# Patient Record
Sex: Female | Born: 1935
Health system: Southern US, Community
[De-identification: ages and names within clinical notes are randomized; demographics above are authoritative.]

## PROBLEM LIST (undated history)

## (undated) DIAGNOSIS — I4891 Unspecified atrial fibrillation: Secondary | ICD-10-CM

## (undated) DIAGNOSIS — Z923 Personal history of irradiation: Secondary | ICD-10-CM

## (undated) DIAGNOSIS — E119 Type 2 diabetes mellitus without complications: Secondary | ICD-10-CM

## (undated) DIAGNOSIS — C50919 Malignant neoplasm of unspecified site of unspecified female breast: Secondary | ICD-10-CM

## (undated) HISTORY — PX: FOOT SURGERY: SHX648

## (undated) HISTORY — DX: Unspecified atrial fibrillation: I48.91

## (undated) HISTORY — PX: APPENDECTOMY: SHX54

## (undated) HISTORY — PX: BREAST SURGERY: SHX581

## (undated) HISTORY — PX: EYE SURGERY: SHX253

## (undated) HISTORY — DX: Type 2 diabetes mellitus without complications: E11.9

## (undated) HISTORY — DX: Malignant neoplasm of unspecified site of unspecified female breast: C50.919

## (undated) HISTORY — PX: VAGINAL HYSTERECTOMY: SUR661

---

## 2004-03-16 ENCOUNTER — Ambulatory Visit: Payer: Self-pay | Admitting: Gastroenterology

## 2004-10-15 ENCOUNTER — Ambulatory Visit: Payer: Self-pay | Admitting: Internal Medicine

## 2005-10-18 ENCOUNTER — Ambulatory Visit: Payer: Self-pay | Admitting: Family Medicine

## 2006-10-24 ENCOUNTER — Ambulatory Visit: Payer: Self-pay | Admitting: Family Medicine

## 2007-11-29 ENCOUNTER — Ambulatory Visit: Payer: Self-pay | Admitting: Family Medicine

## 2008-01-04 ENCOUNTER — Ambulatory Visit: Payer: Self-pay | Admitting: Unknown Physician Specialty

## 2008-01-16 ENCOUNTER — Ambulatory Visit: Payer: Self-pay | Admitting: Unknown Physician Specialty

## 2008-02-06 ENCOUNTER — Ambulatory Visit: Payer: Self-pay | Admitting: Unknown Physician Specialty

## 2008-03-14 ENCOUNTER — Ambulatory Visit: Payer: Self-pay | Admitting: Family Medicine

## 2008-03-17 ENCOUNTER — Ambulatory Visit: Payer: Self-pay | Admitting: Family Medicine

## 2008-04-17 ENCOUNTER — Ambulatory Visit: Payer: Self-pay | Admitting: Family Medicine

## 2008-05-17 ENCOUNTER — Ambulatory Visit: Payer: Self-pay | Admitting: Family Medicine

## 2008-12-01 ENCOUNTER — Ambulatory Visit: Payer: Self-pay | Admitting: Family Medicine

## 2009-12-24 ENCOUNTER — Ambulatory Visit: Payer: Self-pay | Admitting: Family Medicine

## 2010-01-07 ENCOUNTER — Ambulatory Visit: Payer: Self-pay | Admitting: Family Medicine

## 2010-01-17 DIAGNOSIS — C50919 Malignant neoplasm of unspecified site of unspecified female breast: Secondary | ICD-10-CM

## 2010-01-17 DIAGNOSIS — Z923 Personal history of irradiation: Secondary | ICD-10-CM

## 2010-01-17 DIAGNOSIS — C50411 Malignant neoplasm of upper-outer quadrant of right female breast: Secondary | ICD-10-CM | POA: Insufficient documentation

## 2010-01-17 HISTORY — DX: Personal history of irradiation: Z92.3

## 2010-01-17 HISTORY — PX: BREAST BIOPSY: SHX20

## 2010-01-17 HISTORY — PX: BREAST LUMPECTOMY: SHX2

## 2010-01-17 HISTORY — DX: Malignant neoplasm of unspecified site of unspecified female breast: C50.919

## 2010-02-08 ENCOUNTER — Ambulatory Visit: Payer: Self-pay | Admitting: Surgery

## 2010-02-15 ENCOUNTER — Ambulatory Visit: Payer: Self-pay | Admitting: Surgery

## 2010-02-19 LAB — PATHOLOGY REPORT

## 2010-02-22 ENCOUNTER — Ambulatory Visit: Payer: Self-pay | Admitting: Surgery

## 2010-02-25 LAB — PATHOLOGY REPORT

## 2010-03-02 ENCOUNTER — Ambulatory Visit: Payer: Self-pay | Admitting: Oncology

## 2010-03-10 ENCOUNTER — Ambulatory Visit: Payer: Self-pay | Admitting: Oncology

## 2010-03-18 ENCOUNTER — Ambulatory Visit: Payer: Self-pay | Admitting: Oncology

## 2010-04-18 ENCOUNTER — Ambulatory Visit: Payer: Self-pay | Admitting: Oncology

## 2010-05-18 ENCOUNTER — Ambulatory Visit: Payer: Self-pay | Admitting: Oncology

## 2010-06-18 ENCOUNTER — Ambulatory Visit: Payer: Self-pay | Admitting: Oncology

## 2010-07-18 ENCOUNTER — Ambulatory Visit: Payer: Self-pay | Admitting: Oncology

## 2010-09-07 ENCOUNTER — Ambulatory Visit: Payer: Self-pay | Admitting: Oncology

## 2010-09-18 ENCOUNTER — Ambulatory Visit: Payer: Self-pay | Admitting: Oncology

## 2010-12-28 ENCOUNTER — Ambulatory Visit: Payer: Self-pay | Admitting: Oncology

## 2010-12-29 ENCOUNTER — Ambulatory Visit: Payer: Self-pay | Admitting: Oncology

## 2010-12-31 ENCOUNTER — Ambulatory Visit: Payer: Self-pay | Admitting: Oncology

## 2011-01-18 ENCOUNTER — Ambulatory Visit: Payer: Self-pay | Admitting: Oncology

## 2011-01-18 HISTORY — PX: BREAST BIOPSY: SHX20

## 2011-02-18 ENCOUNTER — Ambulatory Visit: Payer: Self-pay | Admitting: Oncology

## 2011-03-25 ENCOUNTER — Ambulatory Visit: Payer: Self-pay | Admitting: Oncology

## 2011-04-18 ENCOUNTER — Ambulatory Visit: Payer: Self-pay | Admitting: Oncology

## 2011-06-27 ENCOUNTER — Ambulatory Visit: Payer: Self-pay | Admitting: Oncology

## 2011-06-29 ENCOUNTER — Ambulatory Visit: Payer: Self-pay | Admitting: Oncology

## 2011-07-11 ENCOUNTER — Ambulatory Visit: Payer: Self-pay | Admitting: Surgery

## 2011-07-12 LAB — PATHOLOGY REPORT

## 2011-07-18 ENCOUNTER — Ambulatory Visit: Payer: Self-pay | Admitting: Oncology

## 2011-07-22 LAB — URINALYSIS, COMPLETE
Bilirubin,UR: NEGATIVE
Glucose,UR: NEGATIVE mg/dL (ref 0–75)
Ketone: NEGATIVE
Nitrite: NEGATIVE
Protein: 100
RBC,UR: 4231 /HPF (ref 0–5)
Specific Gravity: 1.029 (ref 1.003–1.030)
Squamous Epithelial: 4
WBC UR: 1427 /HPF (ref 0–5)

## 2011-07-24 LAB — URINE CULTURE

## 2011-08-18 ENCOUNTER — Ambulatory Visit: Payer: Self-pay | Admitting: Oncology

## 2011-12-29 ENCOUNTER — Ambulatory Visit: Payer: Self-pay | Admitting: Surgery

## 2011-12-29 ENCOUNTER — Ambulatory Visit: Payer: Self-pay | Admitting: Oncology

## 2012-01-18 ENCOUNTER — Ambulatory Visit: Payer: Self-pay | Admitting: Oncology

## 2012-02-09 IMAGING — US ULTRASOUND LEFT BREAST
1 series · 17 of 22 positions shown · non-contrast
Comparison: 12/28/2010.

REASON FOR EXAM: AV LT SMALL ASYMMETRY
COMMENTS:

PROCEDURE:     US  - US BREAST LEFT  - December 29, 2010  [DATE]
RESULT:

[Series 1: ultrasound left breast · 17 of 22 slices shown]
[im 1/22]
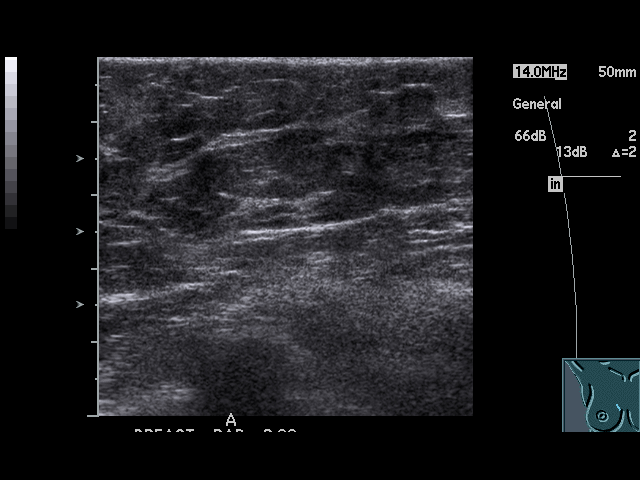
[im 2/22]
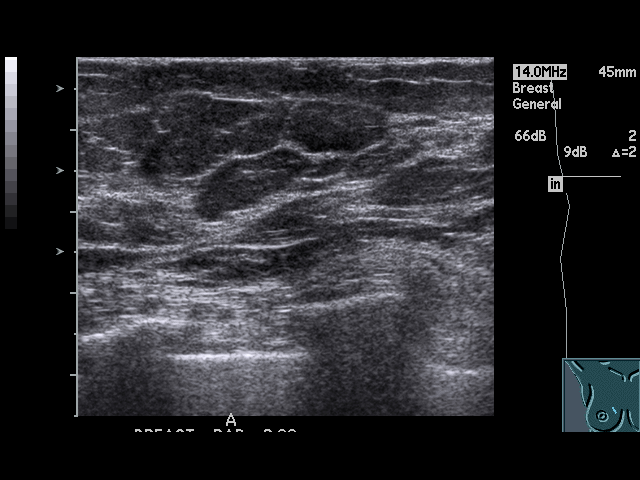
[im 4/22]
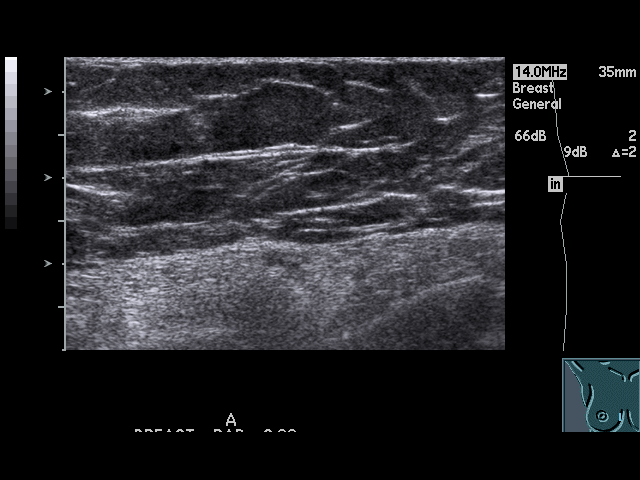
[im 5/22]
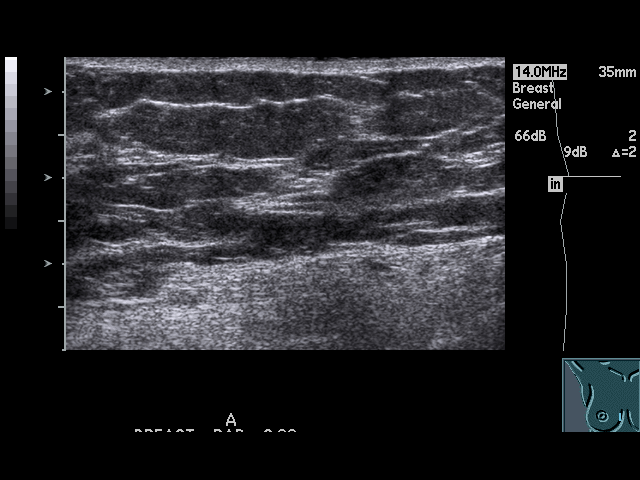
[im 6/22]
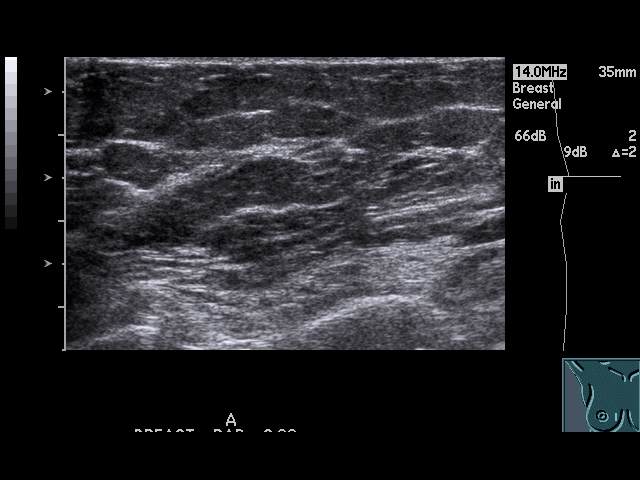
[im 8/22]
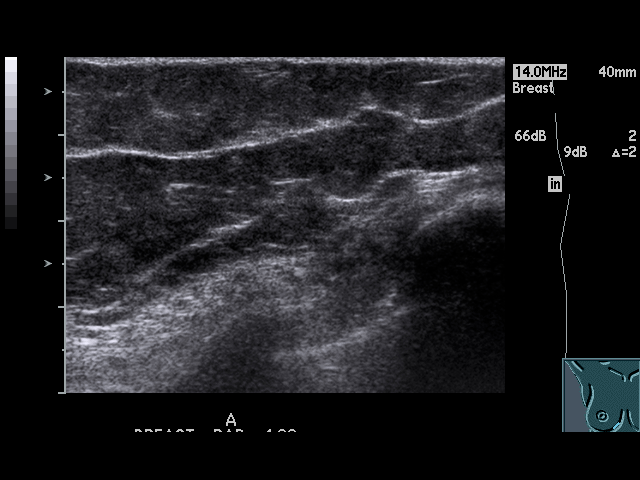
[im 9/22]
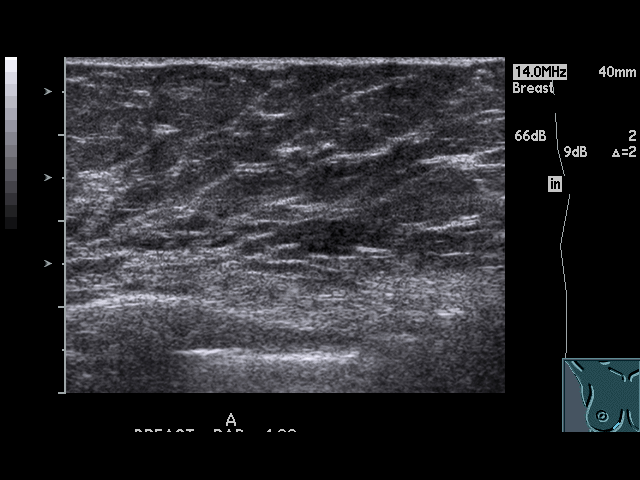
[im 10/22]
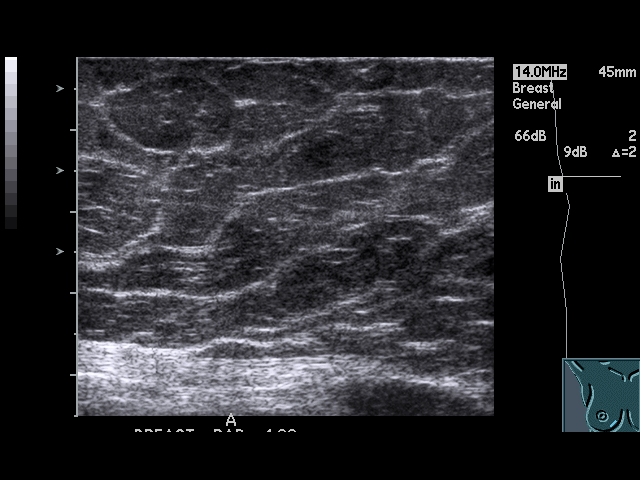
[im 12/22]
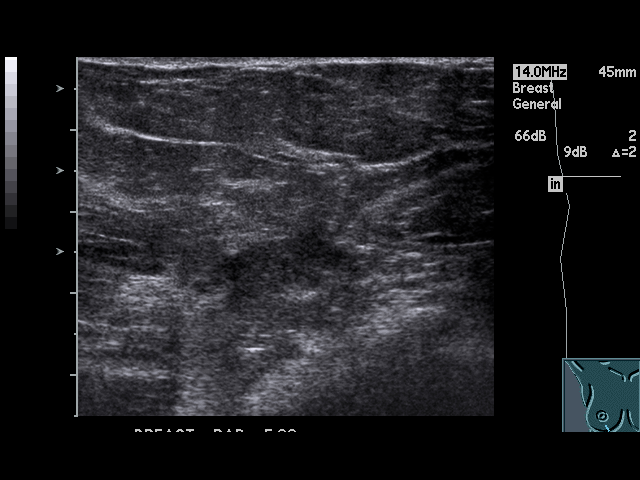
[im 13/22]
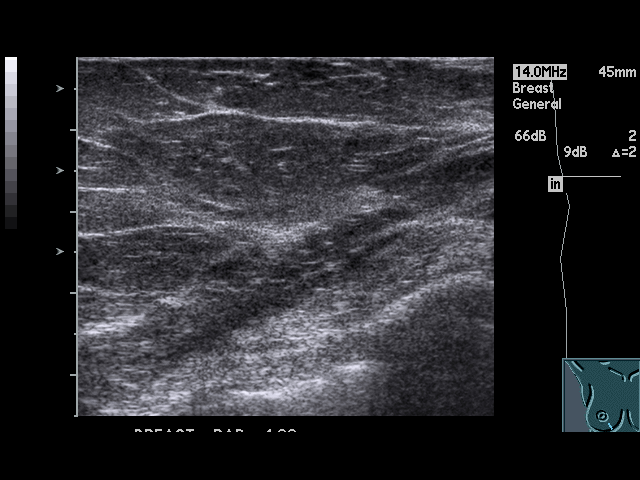
[im 14/22]
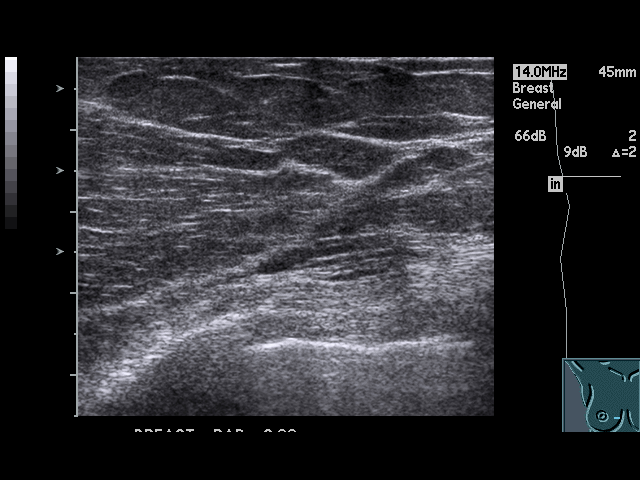
[im 15/22]
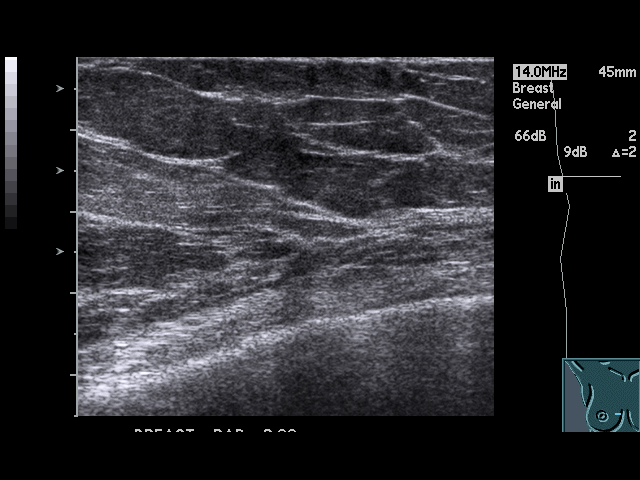
[im 17/22]
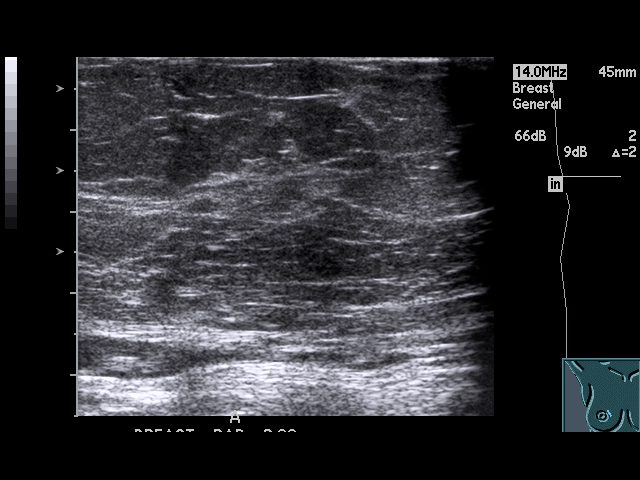
[im 18/22]
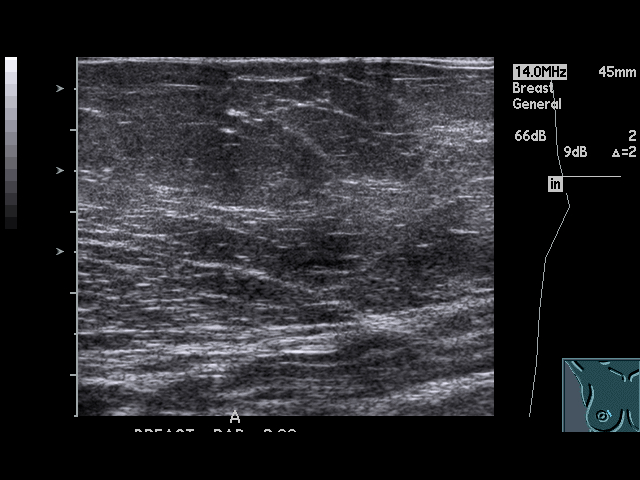
[im 19/22]
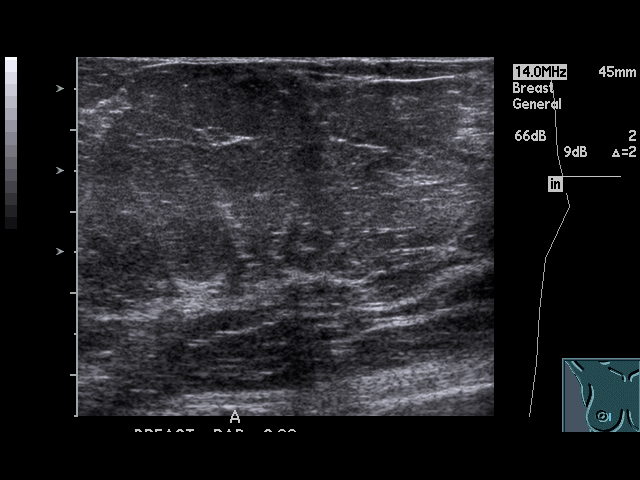
[im 21/22]
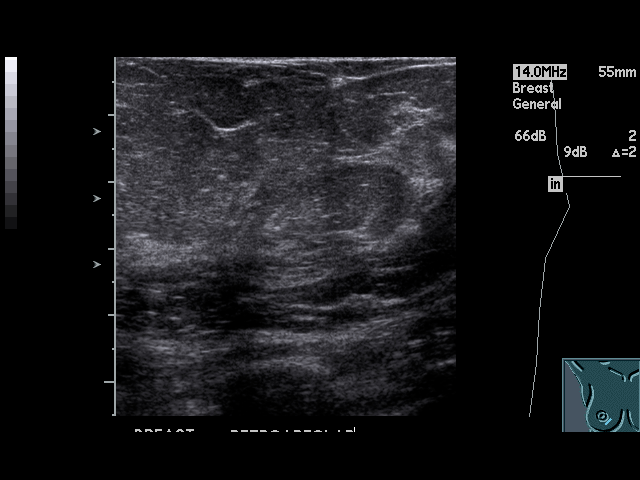
[im 22/22]
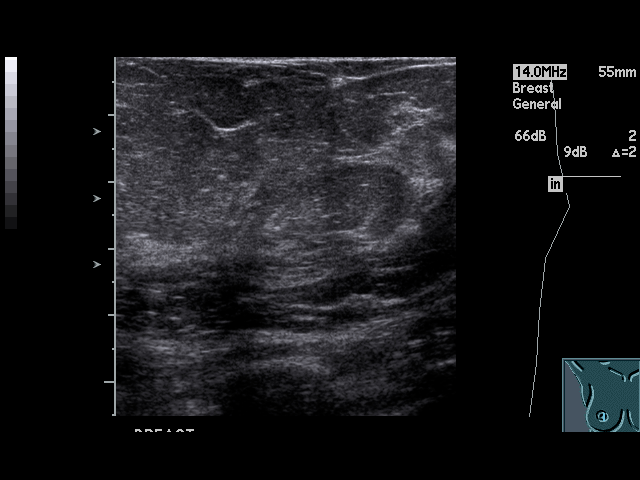

[17 of 22 positions shown; findings below may reference images not displayed]

FINDINGS: Spot compression magnification views were performed to evaluate the findings
on the prior mammograms. With spot compression magnification in the MLO
view, this asymmetry persisted and appeared to have a round contour of a
mass. Some of the borders were well circumscribed while other borders were
obscured by overlying tissue. This is felt to correlate with the small round
mass seen on the exaggerated lateral CC view performed on today's study.
Spot compression magnification in the CC dimension was difficult secondary
to difficulties with patient positioning. However, on at least one of the
spot compression magnification CC views, it appeared to persist. Exaggerated
lateral CC views were also repeated with additional mole markers. However,
this mass did not correlate with the additional mole markers. The mass is
located at approximately 2-3 o'clock in the lateral left breast.

Real-time ultrasound was performed of the left breast from 2 o'clock to 3
o'clock. No mass or suspicious shadowing was identified.
IMPRESSION: BI-RADS: Category 4 - Suspicious Abnormality

RECOMMDATION:  Surgical consultation and tissue diagnosis are suggested for
the indeterminate, small, round mass in the lateral left breast. As this was
not seen by ultrasound, biopsy could be performed with Stereotactic Guided
Biopsy.

## 2012-02-18 ENCOUNTER — Ambulatory Visit: Payer: Self-pay | Admitting: Oncology

## 2012-07-02 ENCOUNTER — Ambulatory Visit: Payer: Self-pay | Admitting: Surgery

## 2012-07-19 ENCOUNTER — Ambulatory Visit: Payer: Self-pay | Admitting: Oncology

## 2012-07-23 ENCOUNTER — Ambulatory Visit: Payer: Self-pay | Admitting: Oncology

## 2012-07-25 ENCOUNTER — Ambulatory Visit: Payer: Self-pay | Admitting: Family Medicine

## 2012-08-17 ENCOUNTER — Ambulatory Visit: Payer: Self-pay | Admitting: Oncology

## 2012-08-17 ENCOUNTER — Ambulatory Visit: Payer: Self-pay | Admitting: Family Medicine

## 2012-09-17 ENCOUNTER — Ambulatory Visit: Payer: Self-pay | Admitting: Family Medicine

## 2012-12-27 ENCOUNTER — Ambulatory Visit: Payer: Self-pay | Admitting: Oncology

## 2013-01-15 ENCOUNTER — Ambulatory Visit: Payer: Self-pay | Admitting: Oncology

## 2013-01-17 ENCOUNTER — Ambulatory Visit: Payer: Self-pay | Admitting: Oncology

## 2013-01-31 ENCOUNTER — Ambulatory Visit: Payer: Self-pay | Admitting: Podiatrist

## 2013-02-14 ENCOUNTER — Ambulatory Visit: Payer: Self-pay | Admitting: Podiatrist

## 2013-02-17 ENCOUNTER — Ambulatory Visit: Payer: Self-pay | Admitting: Oncology

## 2013-02-21 ENCOUNTER — Encounter: Payer: Self-pay | Admitting: Podiatrist

## 2013-02-21 ENCOUNTER — Ambulatory Visit (INDEPENDENT_AMBULATORY_CARE_PROVIDER_SITE_OTHER): Payer: Medicare Other | Admitting: Podiatrist

## 2013-02-21 VITALS — BP 102/53 | HR 64 | Resp 16 | Ht 60.0 in | Wt 188.0 lb

## 2013-02-21 DIAGNOSIS — M79609 Pain in unspecified limb: Secondary | ICD-10-CM

## 2013-02-21 DIAGNOSIS — B351 Tinea unguium: Secondary | ICD-10-CM

## 2013-02-21 NOTE — Patient Instructions (Signed)
KERASAL topical nail film-- available at drug stores in the foot care isle--

## 2013-02-21 NOTE — Progress Notes (Signed)
HPI:  Patient presents today for follow up of foot and nail care. She has a thick toenail on the 5th digit of the right foot which she is unable to trim on her own.  She is able to take care of the remaining toenails  Objective:  Patients chart is reviewed.  Neurovascular status unchanged with palpable, symmetric pedal pulses at 2/4 bilateral.  Patients nails are thickened, discolored, distrophic, friable and brittle with yellow-brown discoloration of the 5th digital nail right. Patient subjectively relates it is painful with shoes and with ambulation   Assessment:  Symptomatic onychomycosis x 1 toenail  Plan:  Discussed treatment options and alternatives.  The symptomatic toenail was debrided through manual an mechanical means without complication.  Recommended otc kerasal nail treatment for the nail.  Patient to return prn    Trudie Buckler, DPM

## 2013-07-03 ENCOUNTER — Ambulatory Visit: Payer: Self-pay | Admitting: Oncology

## 2013-07-05 ENCOUNTER — Ambulatory Visit: Payer: Self-pay | Admitting: Oncology

## 2013-07-17 ENCOUNTER — Ambulatory Visit: Payer: Self-pay | Admitting: Oncology

## 2014-01-02 ENCOUNTER — Ambulatory Visit: Payer: Self-pay | Admitting: Oncology

## 2014-01-13 LAB — CBC CANCER CENTER
BASOS ABS: 0.1 x10 3/mm (ref 0.0–0.1)
Basophil %: 0.7 %
EOS ABS: 0.1 x10 3/mm (ref 0.0–0.7)
Eosinophil %: 1.1 %
HCT: 43.6 % (ref 35.0–47.0)
HGB: 14.3 g/dL (ref 12.0–16.0)
Lymphocyte #: 1.6 x10 3/mm (ref 1.0–3.6)
Lymphocyte %: 21.8 %
MCH: 31.5 pg (ref 26.0–34.0)
MCHC: 32.8 g/dL (ref 32.0–36.0)
MCV: 96 fL (ref 80–100)
MONOS PCT: 9.9 %
Monocyte #: 0.7 x10 3/mm (ref 0.2–0.9)
NEUTROS ABS: 4.8 x10 3/mm (ref 1.4–6.5)
Neutrophil %: 66.5 %
Platelet: 260 x10 3/mm (ref 150–440)
RBC: 4.54 10*6/uL (ref 3.80–5.20)
RDW: 12.9 % (ref 11.5–14.5)
WBC: 7.2 x10 3/mm (ref 3.6–11.0)

## 2014-01-13 LAB — COMPREHENSIVE METABOLIC PANEL
ALK PHOS: 68 U/L
ANION GAP: 11 (ref 7–16)
Albumin: 3.6 g/dL (ref 3.4–5.0)
BUN: 18 mg/dL (ref 7–18)
Bilirubin,Total: 0.4 mg/dL (ref 0.2–1.0)
CALCIUM: 9.4 mg/dL (ref 8.5–10.1)
CHLORIDE: 101 mmol/L (ref 98–107)
Co2: 29 mmol/L (ref 21–32)
Creatinine: 0.84 mg/dL (ref 0.60–1.30)
EGFR (Non-African Amer.): >60
Glucose: 158 mg/dL — ABNORMAL HIGH (ref 65–99)
Osmolality: 286 (ref 275–301)
Potassium: 4 mmol/L (ref 3.5–5.1)
SGOT(AST): 13 U/L — ABNORMAL LOW (ref 15–37)
SGPT (ALT): 21 U/L
Sodium: 141 mmol/L (ref 136–145)
TOTAL PROTEIN: 7.1 g/dL (ref 6.4–8.2)

## 2014-01-15 LAB — CANCER ANTIGEN 27.29: CA 27.29: 22.4 U/mL (ref 0.0–38.6)

## 2014-01-17 ENCOUNTER — Ambulatory Visit: Payer: Self-pay | Admitting: Oncology

## 2014-04-10 ENCOUNTER — Ambulatory Visit (INDEPENDENT_AMBULATORY_CARE_PROVIDER_SITE_OTHER): Payer: Medicare PPO | Admitting: Podiatry

## 2014-04-10 DIAGNOSIS — B351 Tinea unguium: Secondary | ICD-10-CM | POA: Diagnosis not present

## 2014-04-10 DIAGNOSIS — M79676 Pain in unspecified toe(s): Secondary | ICD-10-CM | POA: Diagnosis not present

## 2014-04-13 NOTE — Progress Notes (Signed)
Patient ID: Lisa Crawford, female   DOB: 1935/08/08, 79 y.o.   MRN: [id number]  Subjective: 79 y.o.-year-old female returns the office today for painful, elongated, thickened bilateral 5th toenails which she is unable to trim herself. Denies any redness or drainage around the nails. Denies any acute changes since last appointment and no new complaints today. Denies any systemic complaints such as fevers, chills, nausea, vomiting.   Objective: AAO 3, NAD DP/PT pulses palpable, CRT less than 3 seconds Protective sensation intact with Simms Weinstein monofilament, Achilles tendon reflex intact.  Nails hypertrophic, dystrophic, elongated, brittle, discolored 2 to bilateral 5th digits. There is tenderness overlying bilateral 5th digit nails. There is no surrounding erythema or drainage along the nail sites. No open lesions or pre-ulcerative lesions are identified. No other areas of tenderness bilateral lower extremities. No overlying edema, erythema, increased warmth. No pain with calf compression, swelling, warmth, erythema.  Assessment: Patient presents with symptomatic onychomycosis x2  Plan: -Treatment options including alternatives, risks, complications were discussed -Nails sharply debrided without complication/bleeding. -Discussed daily foot inspection. If there are any changes, to call the office immediately.  -Follow-up in 3 months or sooner if any problems are to arise. In the meantime, encouraged to call the office with any questions, concerns, changes symptoms.

## 2014-05-02 ENCOUNTER — Other Ambulatory Visit: Payer: Self-pay | Admitting: Oncology

## 2014-05-02 DIAGNOSIS — M810 Age-related osteoporosis without current pathological fracture: Secondary | ICD-10-CM

## 2014-05-02 DIAGNOSIS — Z853 Personal history of malignant neoplasm of breast: Secondary | ICD-10-CM

## 2014-07-07 ENCOUNTER — Other Ambulatory Visit: Payer: Self-pay | Admitting: Oncology

## 2014-07-07 ENCOUNTER — Ambulatory Visit
Admission: RE | Admit: 2014-07-07 | Discharge: 2014-07-07 | Disposition: A | Discharge status: Home / Self Care | Payer: Medicare PPO | Source: Ambulatory Visit | Attending: Oncology | Admitting: Oncology

## 2014-07-07 ENCOUNTER — Ambulatory Visit: Payer: Self-pay

## 2014-07-07 DIAGNOSIS — Z853 Personal history of malignant neoplasm of breast: Secondary | ICD-10-CM

## 2014-07-07 DIAGNOSIS — M810 Age-related osteoporosis without current pathological fracture: Secondary | ICD-10-CM

## 2014-07-07 DIAGNOSIS — M8580 Other specified disorders of bone density and structure, unspecified site: Secondary | ICD-10-CM | POA: Diagnosis not present

## 2014-07-07 DIAGNOSIS — Z13820 Encounter for screening for osteoporosis: Secondary | ICD-10-CM | POA: Diagnosis present

## 2014-07-17 ENCOUNTER — Inpatient Hospital Stay: Payer: Medicare PPO | Attending: Oncology | Admitting: Oncology

## 2014-07-17 VITALS — BP 126/75 | HR 57 | Temp 98.4°F | Resp 19 | Ht 61.0 in | Wt 199.5 lb

## 2014-07-17 DIAGNOSIS — Z79899 Other long term (current) drug therapy: Secondary | ICD-10-CM | POA: Diagnosis not present

## 2014-07-17 DIAGNOSIS — G4700 Insomnia, unspecified: Secondary | ICD-10-CM | POA: Insufficient documentation

## 2014-07-17 DIAGNOSIS — Z170 Estrogen receptor positive status [ER+]: Secondary | ICD-10-CM | POA: Diagnosis not present

## 2014-07-17 DIAGNOSIS — I4891 Unspecified atrial fibrillation: Secondary | ICD-10-CM | POA: Diagnosis not present

## 2014-07-17 DIAGNOSIS — M8580 Other specified disorders of bone density and structure, unspecified site: Secondary | ICD-10-CM | POA: Insufficient documentation

## 2014-07-17 DIAGNOSIS — Z923 Personal history of irradiation: Secondary | ICD-10-CM | POA: Diagnosis not present

## 2014-07-17 DIAGNOSIS — Z79811 Long term (current) use of aromatase inhibitors: Secondary | ICD-10-CM | POA: Insufficient documentation

## 2014-07-17 DIAGNOSIS — I10 Essential (primary) hypertension: Secondary | ICD-10-CM | POA: Insufficient documentation

## 2014-07-17 DIAGNOSIS — E039 Hypothyroidism, unspecified: Secondary | ICD-10-CM | POA: Insufficient documentation

## 2014-07-17 DIAGNOSIS — B029 Zoster without complications: Secondary | ICD-10-CM | POA: Insufficient documentation

## 2014-07-17 DIAGNOSIS — D649 Anemia, unspecified: Secondary | ICD-10-CM | POA: Insufficient documentation

## 2014-07-17 DIAGNOSIS — C50911 Malignant neoplasm of unspecified site of right female breast: Secondary | ICD-10-CM | POA: Insufficient documentation

## 2014-07-17 DIAGNOSIS — H409 Unspecified glaucoma: Secondary | ICD-10-CM | POA: Insufficient documentation

## 2014-07-17 DIAGNOSIS — M1990 Unspecified osteoarthritis, unspecified site: Secondary | ICD-10-CM | POA: Insufficient documentation

## 2014-07-17 DIAGNOSIS — E119 Type 2 diabetes mellitus without complications: Secondary | ICD-10-CM | POA: Insufficient documentation

## 2014-07-17 DIAGNOSIS — R519 Headache, unspecified: Secondary | ICD-10-CM | POA: Insufficient documentation

## 2014-07-17 DIAGNOSIS — R51 Headache: Secondary | ICD-10-CM

## 2014-07-17 DIAGNOSIS — J302 Other seasonal allergic rhinitis: Secondary | ICD-10-CM | POA: Insufficient documentation

## 2014-07-17 DIAGNOSIS — H269 Unspecified cataract: Secondary | ICD-10-CM | POA: Insufficient documentation

## 2014-07-28 NOTE — Progress Notes (Signed)
[facility]  [phone] [phone]  ID: Lisa Crawford [place] OB: 01-20-1935  MR#: [id number]  JOA#:[id number]  Patient Care Team: [place] Pink, MD as PCP - General (Family Medicine)  CHIEF COMPLAINT:  Chief Complaint  Patient presents with  . Follow-up    Breast Cancer    INTERVAL HISTORY: Patient returns to clinic for routine six-month follow-up. She continues to tolerate letrozole well without significant side effects. She denies any hot flashes, myalgias, or arthralgias. She has a good appetite.  She has no neurologic complaints.   She denies any recent fevers or illnesses.  She does not complain of weakness and fatigue.  She denies any bleeding or bruising.  She denies any chest pain, shortness of breath, cough, or hemoptysis.  She denies any nausea, vomiting, constipation, or diarrhea.  She denies any melena or hematochezia.  She offers no specific complaints today.  REVIEW OF SYSTEMS:   Review of Systems  Constitutional: Negative.   Musculoskeletal: Negative.     As per HPI. Otherwise, a complete review of systems is negatve.  PAST MEDICAL HISTORY: Past Medical History  Diagnosis Date  . A-fib   . Diabetes   . Breast cancer right    2012 -radiation    PAST SURGICAL HISTORY: Past Surgical History  Procedure Laterality Date  . Appendectomy    . Vaginal hysterectomy    . Foot surgery Left   . Breast surgery Right   . Eye surgery Bilateral   . Breast biopsy Right 2012    FAMILY HISTORY No family history on file.     ADVANCED DIRECTIVES:    HEALTH MAINTENANCE: History  Substance Use Topics  . Smoking status: Never Smoker   . Smokeless tobacco: Not on file  . Alcohol Use: No     Colonoscopy:  PAP:  Bone density:  Lipid panel:  Allergies  Allergen Reactions  . Antihistamines, Chlorpheniramine-Type Other (See Comments)    Stay awake  . Antihistamines, Diphenhydramine-Type Other (See Comments)    KEEPS AWAKE AND VERY  WIRED    Current Outpatient Prescriptions  Medication Sig Dispense Refill  . acetaminophen (TYLENOL) 500 MG tablet Take 500 mg by mouth 2 (two) times daily.    Marland Kitchen amLODipine-benazepril (LOTREL) 5-40 MG per capsule Take 1 capsule by mouth 2 (two) times daily.    Marland Kitchen Apixaban (ELIQUIS PO) Take by mouth 2 (two) times daily.    Marland Kitchen atenolol (TENORMIN) 25 MG tablet Take by mouth daily.    . divalproex (DEPAKOTE) 500 MG DR tablet Take by mouth.    . flecainide (TAMBOCOR) 50 MG tablet Take 50 mg by mouth 2 (two) times daily.    Marland Kitchen glimepiride (AMARYL) 2 MG tablet TAKE 1 TABLET BY MOUTH 2 (TWO) TIMES DAILY.    Marland Kitchen latanoprost (XALATAN) 0.005 % ophthalmic solution Place 1 drop into both eyes at bedtime.    Marland Kitchen letrozole (FEMARA) 2.5 MG tablet Take 2.5 mg by mouth daily.    . Levothyroxine Sodium 50 MCG CAPS Take by mouth daily before breakfast.    . metFORMIN (GLUCOPHAGE) 500 MG tablet Take 1,000 mg by mouth 2 (two) times daily with a meal.    . Multiple Vitamin (MULTIVITAMIN) tablet Take 1 tablet by mouth daily.    . Timolol Maleate PF 0.5 % SOLN Apply 1 drop to eye every evening.     No current facility-administered medications for this visit.    OBJECTIVE: Filed Vitals:   07/17/14 1149  BP: 126/75  Pulse:  57  Temp: 98.4 F (36.9 C)  Resp: 19     Body mass index is 37.72 kg/(m^2).    ECOG FS:0 - Asymptomatic  General: Well-developed, well-nourished, no acute distress. Eyes: anicteric sclera. Breasts: Bilateral breast and axilla without lumps or masses. Lungs: Clear to auscultation bilaterally. Heart: Regular rate and rhythm. No rubs, murmurs, or gallops. Abdomen: Soft, nontender, nondistended. No organomegaly noted, normoactive bowel sounds. Musculoskeletal: No edema, cyanosis, or clubbing. Neuro: Alert, answering all questions appropriately. Cranial nerves grossly intact. Skin: No rashes or petechiae noted. Psych: Normal affect.  LAB RESULTS:  Lab Results  Component Value Date   NA 141  01/13/2014   K 4.0 01/13/2014   CL 101 01/13/2014   CO2 29 01/13/2014   GLUCOSE 158* 01/13/2014   BUN 18 01/13/2014   CREATININE 0.84 01/13/2014   CALCIUM 9.4 01/13/2014   PROT 7.1 01/13/2014   ALBUMIN 3.6 01/13/2014   AST 13* 01/13/2014   ALT 21 01/13/2014   ALKPHOS 68 01/13/2014    Lab Results  Component Value Date   WBC 7.2 01/13/2014   NEUTROABS 4.8 01/13/2014   HGB 14.3 01/13/2014   HCT 43.6 01/13/2014   MCV 96 01/13/2014   PLT 260 01/13/2014     STUDIES: Dg Bone Density  07/07/2014   EXAM: DUAL X-RAY ABSORPTIOMETRY (DXA) FOR BONE MINERAL DENSITY  IMPRESSION: Dear Dr Grayland Ormond, Your patient Lisa Crawford completed a BMD test on 07/07/2014 using the [place] (analysis version: 14.10) manufactured by [employer]. The following summarizes the results of our evaluation.  PATIENT BIOGRAPHICAL: Name: Lisa, Crawford Patient ID: [id number] Birth Date: 1935/08/13 Height: 60.0 in. Gender: Female Exam Date: 07/07/2014 Weight: 200.0 lbs.  Indications: Advanced Age, Breast CA, Caucasian, Height Loss, History of Fracture (Adult), Hysterectomy, Postmenopausal Fractures: left ankle, Right Foot Treatments: CALCIUM VIT D, femara, LEVOTHYROXINE  ASSESSMENT:  The BMD measured at Femur Neck Right is 0.887 g/cm2 with a T-score of -1.1. This patient is considered osteopenic according to [facility]) criteria. L-3 was excluded due to degenerative changes. Site Region Measured Measured WHO Young Adult BMD Date       Age      Classification T-score AP Spine L1-L4 (L3) 07/07/2014 76.8 Normal 1.8 1.382 g/cm2 AP Spine L1-L4 (L3) 07/03/2013 75.8 Normal 1.4 1.353 g/cm2 AP Spine L1-L4 (L3) 07/19/2012 74.9 Normal 1.7 1.391 g/cm2  DualFemur Neck Right 07/07/2014 76.8 Osteopenia -1.1 0.887 g/cm2 DualFemur Neck Right 07/03/2013 75.8 Osteopenia -1.1 0.881 g/cm2 DualFemur Neck Right 07/19/2012 74.9 Osteopenia -1.1 0.890 g/cm2 DualFemur Neck Right 07/19/2011 73.9 Normal -1.0 0.904 g/cm2  DualFemur Neck Right 06/24/2010 72.8 Normal -0.9 0.917 g/cm2  World Health Organization [facility]) criteria for post-menopausal, Caucasian Women: Normal:       T-score at or above -1 SD Osteopenia:   T-score between -1 and -2.5 SD Osteoporosis: T-score at or below -2.5 SD  RECOMMENDATIONS: Lisa Crawford recommends that FDA-approved medical therapies be considered in postmenopausal women and men age 72 or older with a: 1. Hip or vertebral (clinical or morphometric) fracture. 2. T-score of < -2.5 at the spine or hip. 3. Ten-year fracture probability by FRAX of 3% or greater for hip fracture or 20% or greater for major osteoporotic fracture.  All treatment decisions require clinical judgment and consideration of individual patient factors, including patient preferences, co-morbidities, previous drug use, risk factors not captured in the FRAX model (e.g. falls, vitamin D deficiency, increased bone turnover, interval significant decline in bone density) and possible  under - or over-estimation of fracture risk by FRAX.  All patients should ensure an adequate intake of dietary calcium (1200 mg/d) and vitamin D (800 IU daily) unless contraindicated.  FOLLOW-UP: People with diagnosed cases of osteoporosis or at high risk for fracture should have regular bone mineral density tests. For patients eligible for Medicare, routine testing is allowed once every 2 years. The testing frequency can be increased to one year for patients who have rapidly progressing disease, those who are receiving or discontinuing medical therapy to restore bone mass, or have additional risk factors.  Your patient Lisa Crawford completed a FRAX assessment on 07/07/2014 using the [place] (analysis version: 14.10) manufactured by [employer]. The following summarizes the results of our evaluation.  PATIENT BIOGRAPHICAL: Name: Lisa, Crawford Patient ID: [id number] Birth Date: 1935/06/30 Height:    60.0 in. Gender:     [age]     [age]       Weight:    200.0 lbs. Ethnicity:  White                            Exam Date: 07/07/2014  FRAX* RESULTS:  (version: 3.5) 10-year Probability of Fracture1 Major Osteoporotic Fracture2 Hip Fracture 14.5% 2.2% Population: [place] (Caucasian) Risk Factors: History of Fracture (Adult)  Based on Femur (Right) Neck BMD  1 -The 10-year probability of fracture may be lower than reported if the patient has received treatment. 2 -Major Osteoporotic Fracture: Clinical Spine, Forearm, Hip or Shoulder  *FRAX is a Materials engineer of the [employer] for Metabolic Bone Disease, a [place] (WHO) [facility].  ASSESSMENT: The probability of a major osteoporotic fracture is 14.5% within the next ten years.  The probability of a hip fracture is 2.2% within the next ten years.   Electronically Signed   By: Earle Gell M.D.   On: 07/07/2014 12:25   Mm Diag Breast Tomo Bilateral  07/07/2014   CLINICAL DATA:  79 year old female for annual bilateral mammograms. History of right breast cancer and lumpectomy in 2012.  EXAM: DIGITAL DIAGNOSTIC BILATERAL MAMMOGRAM WITH 3D TOMOSYNTHESIS AND CAD  COMPARISON:  07/03/2013 and prior mammograms dating back to 07/11/2002  ACR Breast Density Category a: The breast tissue is almost entirely fatty.  FINDINGS: A magnification view of the lumpectomy site and routine views of both breasts demonstrate no suspicious mass, nonsurgical distortion or worrisome calcifications.  Right breast scarring again identified.  Mammographic images were processed with CAD.  IMPRESSION: No evidence of breast malignancy.  Right breast scarring.  RECOMMENDATION: Bilateral diagnostic mammograms in 1 year.  I have discussed the findings and recommendations with the patient. Results were also provided in writing at the conclusion of the visit. If applicable, a reminder letter will be sent to the patient regarding the next appointment.  BI-RADS CATEGORY   2: Benign.   Electronically Signed   By: Margarette [place] M.D.   On: 07/07/2014 10:24    ASSESSMENT: Adenocarcinoma of the right breast status post lumpectomy.  ER/PR positive, HER-2 negative, stage Ia.  PLAN:    1.  Breast cancer: No evidence of disease. Continue letrozole completing in May 2017.  Her most recent mammogram on July 07, 2014 was reported as BI-RADS 2. Repeat in June 2017. Return to clinic in 6 months for routine evaluation. 2. Osteopenia:  Her most recent bone mineral density on  July 07, 2014 reported a T score of -1.1, which is unchanged from previous.  Continue calcium and vitamin D.   Patient expressed understanding and was in agreement with this plan. She also understands that She can call clinic at any time with any questions, concerns, or complaints.   Lloyd Huger, MD   07/28/2014 9:36 AM

## 2014-09-04 ENCOUNTER — Encounter: Payer: Self-pay | Admitting: Podiatry

## 2014-09-04 ENCOUNTER — Ambulatory Visit (INDEPENDENT_AMBULATORY_CARE_PROVIDER_SITE_OTHER): Payer: Medicare PPO | Admitting: Podiatry

## 2014-09-04 VITALS — BP 124/79 | HR 60 | Resp 17

## 2014-09-04 DIAGNOSIS — L601 Onycholysis: Secondary | ICD-10-CM

## 2014-09-04 DIAGNOSIS — B351 Tinea unguium: Secondary | ICD-10-CM | POA: Diagnosis not present

## 2014-09-04 NOTE — Progress Notes (Signed)
Patient ID: Lisa Crawford, female   DOB: [birth year], 79 y.o.   MRN: [id number]  Subjective: 79 year old female presents the office today with concerns her right fifth toenail becoming loose. She says a couple weeks ago she caught her toenail the shoe causing toenail to become loose. She had some redness around the nail she would her primary care physician who started on antibiotics which she continues to take. She denies any drainage or any pus. Denies any redness strength. No other complaints at this time in no acute changes.  Objective: AAO 3, NAD DP/PT pulses palpable, CRT less than 3 seconds Protective sensation intact with Derrel Nip monofilament Right fifth digit toenail significantly hypertrophic, dystrophic, brittle, discolored and loose and the underlying nailbed distally however is firmly adhered proximally. There is a faint amount of erythema around the nailbed without any ascending saline this. The erythema appears to be more inflammation of the posterior infection. There is mild tenderness palpation along the nail. There is no surrounding erythema, edema, drainage/purulence. No other areas of tenderness to bilateral lower extremities. No overlying edema, erythema, increase in warmth elsewhere. No pain with calf compression, swelling, warmth, erythema.  Assessment: 79 year old female onycholysis   Plan: -Treatment options discussed including all alternatives, risks, and complications -I discussed nail removal versus nail debridement the patient. She doesn't proceed with nail debridement. The nail sharply debrided without, occasions. After debridement the proximal nail appears to be firmly adhered. There is faint amount of erythema around the nailbed however there is no drainage/purulence, ascending cellulitis, edema. Finish the course vein arise. This is not resolved discussed with her she'll likely need total nail removal. She understands this. -Follow-up if symptoms are not resolved  within 1-2 weeks or forcing or sooner if any problems arise. In the meantime, encouraged to call the office with any questions, concerns, change in symptoms.   Celesta Gentile, DPM

## 2014-11-26 ENCOUNTER — Other Ambulatory Visit: Payer: Self-pay | Admitting: Oncology

## 2014-12-10 ENCOUNTER — Emergency Department: Payer: Medicare PPO

## 2014-12-10 ENCOUNTER — Emergency Department
Admission: EM | Admit: 2014-12-10 | Discharge: 2014-12-10 | Disposition: A | Discharge status: Home / Self Care | Payer: Medicare PPO | Attending: Emergency Medicine | Admitting: Emergency Medicine

## 2014-12-10 DIAGNOSIS — Z79899 Other long term (current) drug therapy: Secondary | ICD-10-CM | POA: Diagnosis not present

## 2014-12-10 DIAGNOSIS — M25512 Pain in left shoulder: Secondary | ICD-10-CM | POA: Diagnosis not present

## 2014-12-10 DIAGNOSIS — I10 Essential (primary) hypertension: Secondary | ICD-10-CM | POA: Insufficient documentation

## 2014-12-10 DIAGNOSIS — Z7901 Long term (current) use of anticoagulants: Secondary | ICD-10-CM | POA: Diagnosis not present

## 2014-12-10 DIAGNOSIS — Z7984 Long term (current) use of oral hypoglycemic drugs: Secondary | ICD-10-CM | POA: Insufficient documentation

## 2014-12-10 DIAGNOSIS — E119 Type 2 diabetes mellitus without complications: Secondary | ICD-10-CM | POA: Diagnosis not present

## 2014-12-10 MED ORDER — NAPROXEN 500 MG PO TABS: 500.0000 mg | ORAL_TABLET | Freq: Two times a day (BID) | ORAL | Status: DC

## 2014-12-10 NOTE — ED Provider Notes (Signed)
[facility] Emergency Department Provider Note  ____________________________________________  Time seen: 11:40 AM  I have reviewed the triage vital signs and the nursing notes.   HISTORY  Chief Complaint Shoulder Pain    HPI Lisa Crawford is a 79 y.o. female who complains of left shoulder pain since she woke up yesterday morning. No trips slips or falls, no injuries or carrying heavy objects. No unusual activity with the left arm. It's worse when she puts on her car seat belt and when she puts on her pants. Denies any prior injuries in the left shoulder. No chest pain or shortness of breath, no exertional symptoms.     Past Medical History  Diagnosis Date  . A-fib ([place])   . Diabetes ([place])   . Breast cancer ([place]) right    2012 -radiation   hypertension  hypothyroidism   Patient Active Problem List   Diagnosis Date Noted  . Absolute anemia 07/17/2014  . A-fib ([place]) 07/17/2014  . Cataract 07/17/2014  . Diabetes ([place]) 07/17/2014  . Glaucoma 07/17/2014  . Cephalalgia 07/17/2014  . BP (high blood pressure) 07/17/2014  . Adult hypothyroidism 07/17/2014  . Cannot sleep 07/17/2014  . Arthritis, degenerative 07/17/2014  . Allergic rhinitis, seasonal 07/17/2014  . Herpes zona 07/17/2014  . Breast CA ([place]) 01/17/2010     Past Surgical History  Procedure Laterality Date  . Appendectomy    . Vaginal hysterectomy    . Foot surgery Left   . Breast surgery Right   . Eye surgery Bilateral   . Breast biopsy Right 2012     Current Outpatient Rx  Name  Route  Sig  Dispense  Refill  . acetaminophen (TYLENOL) 500 MG tablet   Oral   Take 500 mg by mouth 2 (two) times daily.         Marland Kitchen amLODipine-benazepril (LOTREL) 5-40 MG per capsule   Oral   Take 1 capsule by mouth 2 (two) times daily.         Marland Kitchen Apixaban (ELIQUIS PO)   Oral   Take by mouth 2 (two) times daily.         Marland Kitchen atenolol (TENORMIN) 25 MG tablet   Oral   Take by mouth daily.        . divalproex (DEPAKOTE) 500 MG DR tablet   Oral   Take by mouth.         . flecainide (TAMBOCOR) 50 MG tablet   Oral   Take 50 mg by mouth 2 (two) times daily.         Marland Kitchen glimepiride (AMARYL) 2 MG tablet      TAKE 1 TABLET BY MOUTH 2 (TWO) TIMES DAILY.         Marland Kitchen latanoprost (XALATAN) 0.005 % ophthalmic solution   Both Eyes   Place 1 drop into both eyes at bedtime.         Marland Kitchen letrozole (FEMARA) 2.5 MG tablet      TAKE 1 TABLET BY MOUTH  ONCE A DAY FOR 30 DAYS   30 tablet   10     CYCLE FILL MEDICATION. Authorization is required f ...   . Levothyroxine Sodium 50 MCG CAPS   Oral   Take by mouth daily before breakfast.         . metFORMIN (GLUCOPHAGE) 500 MG tablet   Oral   Take 1,000 mg by mouth 2 (two) times daily with a meal.         .  Multiple Vitamin (MULTIVITAMIN) tablet   Oral   Take 1 tablet by mouth daily.         . Timolol Maleate PF 0.5 % SOLN   Ophthalmic   Apply 1 drop to eye every evening.            Allergies Antihistamines, chlorpheniramine-type and Antihistamines, diphenhydramine-type   No family history on file.  Social History Social History  Substance Use Topics  . Smoking status: Never Smoker   . Smokeless tobacco: None  . Alcohol Use: No    Review of Systems  Constitutional:   No fever or chills. No weight changes Eyes:   No blurry vision or double vision.  ENT:   No sore throat. Cardiovascular:   No chest pain. Respiratory:   No dyspnea or cough. Gastrointestinal:   Negative for abdominal pain, vomiting and diarrhea.  No BRBPR or melena. Genitourinary:   Negative for dysuria, urinary retention, bloody urine, or difficulty urinating. Musculoskeletal:   Positive left shoulder pain  Skin:   Negative for rash. Neurological:   Negative for headaches, focal weakness or numbness. Psychiatric:  No anxiety or depression.   Endocrine:  No hot/cold intolerance, changes in energy, or sleep difficulty.  10-point  ROS otherwise negative.  ____________________________________________   PHYSICAL EXAM:  VITAL SIGNS: ED Triage Vitals  Enc Vitals Group     BP 12/10/14 1121 173/147 mmHg     Pulse Rate 12/10/14 1121 57     Resp 12/10/14 1121 18     Temp 12/10/14 1121 97.7 F (36.5 C)     Temp Source 12/10/14 1121 Oral     SpO2 12/10/14 1121 97 %     Weight 12/10/14 1121 200 lb (90.719 kg)     Height 12/10/14 1121 5' (1.524 m)     Head Cir --      Peak Flow --      Pain Score 12/10/14 1122 7     Pain Loc --      Pain Edu? --      Excl. in [place]? --      Constitutional:   Alert and oriented. Well appearing and in no distress. Eyes:   No scleral icterus. No conjunctival pallor. PERRL. EOMI ENT   Head:   Normocephalic and atraumatic.   Nose:   No congestion/rhinnorhea. No septal hematoma   Mouth/Throat:   MMM, no pharyngeal erythema. No peritonsillar mass. No uvula shift.   Neck:   No stridor. No SubQ emphysema. No meningismus. Hematological/Lymphatic/Immunilogical:   No cervical lymphadenopathy. Cardiovascular:   RRR. Normal and symmetric distal pulses are present in all extremities. No murmurs, rubs, or gallops. Respiratory:   Normal respiratory effort without tachypnea nor retractions. Breath sounds are clear and equal bilaterally. No wheezes/rales/rhonchi. Gastrointestinal:   Soft and nontender. No distention. There is no CVA tenderness.  No rebound, rigidity, or guarding. Genitourinary:   deferred Musculoskeletal:   Nontender with normal range of motion in all extremities. No joint effusions.  No lower extremity tenderness.  No edema. There is reproducible pain in the lateral left shoulder in the area of the lateral head of the deltoid with abduction of the left shoulder. This is also reproduced with attempted abduction of the shoulder against resistance resulting in isokinetic stress. Neurologic:   Normal speech and language.  CN 2-10 normal. Motor grossly intact. No pronator  drift.  Normal gait. No gross focal neurologic deficits are appreciated.  Skin:    Skin is warm, dry and intact.  No rash noted.  No petechiae, purpura, or bullae. Psychiatric:   Mood and affect are normal. Speech and behavior are normal. Patient exhibits appropriate insight and judgment.  ____________________________________________    LABS (pertinent positives/negatives) (all labs ordered are listed, but only abnormal results are displayed) Labs Reviewed - No data to display ____________________________________________   EKG    ____________________________________________    RADIOLOGY  X-ray left shoulder unremarkable, interpreted by me.  ____________________________________________   PROCEDURES   ____________________________________________   INITIAL IMPRESSION / ASSESSMENT AND PLAN / ED COURSE  Pertinent labs & imaging results that were available during my care of the patient were reviewed by me and considered in my medical decision making (see chart for details).  Patient presents with musculoskeletal pain of the left shoulder. No evidence of fracture dislocation or other serious injury. It appears to be a deltoid tendinitis versus impingement syndrome. Recommend conservative therapy with rest, anti-inflammatory, range of motion exercises. Follow up with primary care, follow-up with orthopedics if symptoms are unimproved or persistent.     ____________________________________________   FINAL CLINICAL IMPRESSION(S) / ED DIAGNOSES  Final diagnoses:  Left shoulder pain      Carrie Mew, MD 12/10/14 1237

## 2014-12-10 NOTE — ED Notes (Signed)
Pt transported to xray with wheelchair.

## 2014-12-10 NOTE — ED Notes (Signed)
Pt states over the past 24hrs she has had pain and difficulty with her left shoulder,  States she was sent by PCP for possible dislocation

## 2014-12-10 NOTE — Discharge Instructions (Signed)
Shoulder Pain  The shoulder is the joint that connects your arms to your body. The bones that form the shoulder joint include the upper arm bone (humerus), the shoulder blade (scapula), and the collarbone (clavicle). The top of the humerus is shaped like a ball and fits into a rather flat socket on the scapula (glenoid cavity). A combination of muscles and strong, fibrous tissues that connect muscles to bones (tendons) support your shoulder joint and hold the ball in the socket. Small, fluid-filled sacs (bursae) are located in different areas of the joint. They act as cushions between the bones and the overlying soft tissues and help reduce friction between the gliding tendons and the bone as you move your arm. Your shoulder joint allows a wide range of motion in your arm. This range of motion allows you to do things like scratch your back or throw a ball. However, this range of motion also makes your shoulder more prone to pain from overuse and injury.  Causes of shoulder pain can originate from both injury and overuse and usually can be grouped in the following four categories:  1. Redness, swelling, and pain (inflammation) of the tendon (tendinitis) or the bursae (bursitis).  2. Instability, such as a dislocation of the joint.  3. Inflammation of the joint (arthritis).  4. Broken bone (fracture).  HOME CARE INSTRUCTIONS   1. Apply ice to the sore area.  ¨ Put ice in a plastic bag.  ¨ Place a towel between your skin and the bag.  ¨ Leave the ice on for 15-20 minutes, 3-4 times per day for the first 2 days, or as directed by your health care provider.  2. Stop using cold packs if they do not help with the pain.  3. If you have a shoulder sling or immobilizer, wear it as long as your caregiver instructs. Only remove it to shower or bathe. Move your arm as little as possible, but keep your hand moving to prevent swelling.  4. Squeeze a soft ball or foam pad as much as possible to help prevent swelling.  5. Only take  over-the-counter or prescription medicines for pain, discomfort, or fever as directed by your caregiver.  SEEK MEDICAL CARE IF:   1. Your shoulder pain increases, or new pain develops in your arm, hand, or fingers.  2. Your hand or fingers become cold and numb.  3. Your pain is not relieved with medicines.  SEEK IMMEDIATE MEDICAL CARE IF:   1. Your arm, hand, or fingers are numb or tingling.  2. Your arm, hand, or fingers are significantly swollen or turn white or blue.  MAKE SURE YOU:   1. Understand these instructions.  2. Will watch your condition.  3. Will get help right away if you are not doing well or get worse.     This information is not intended to replace advice given to you by your health care provider. Make sure you discuss any questions you have with your health care provider.     Document Released: 10/13/2004 Document Revised: 01/24/2014 Document Reviewed: 04/28/2014  Elsevier Interactive Patient Education ©2016 [employer]  Shoulder Range of Motion Exercises  Shoulder range of motion (ROM) exercises are designed to keep the shoulder moving freely. They are often recommended for people who have shoulder pain.  MOVEMENT EXERCISE  When you are able, do this exercise 5-6 days per week, or as told by your health care provider. Work toward doing 2 sets of 10 swings.  Pendulum   Exercise  How To Do This Exercise Lying Down  5. Lie face-down on a bed with your abdomen close to the side of the bed.  6. Let your arm hang over the side of the bed.  7. Relax your shoulder, arm, and hand.  8. Slowly and gently swing your arm forward and back. Do not use your neck muscles to swing your arm. They should be relaxed. If you are struggling to swing your arm, have someone gently swing it for you. When you do this exercise for the first time, swing your arm at a 15 degree angle for 15 seconds, or swing your arm 10 times. As pain lessens over time, increase the angle of the swing to 30-45 degrees.  9. Repeat steps 1-4  with the other arm.  How To Do This Exercise While Standing  6. Stand next to a sturdy chair or table and hold on to it with your hand.  ¨ Bend forward at the waist.  ¨ Bend your knees slightly.  ¨ Relax your other arm and let it hang limp.  ¨ Relax the shoulder blade of the arm that is hanging and let it drop.  ¨ While keeping your shoulder relaxed, use body motion to swing your arm in small circles. The first time you do this exercise, swing your arm for about 30 seconds or 10 times. When you do it next time, swing your arm for a little longer.  ¨ Stand up tall and relax.  ¨ Repeat steps 1-7, this time changing the direction of the circles.  7. Repeat steps 1-8 with the other arm.  STRETCHING EXERCISES  Do these exercises 3-4 times per day on 5-6 days per week or as told by your health care provider. Work toward holding the stretch for 20 seconds.  Stretching Exercise 1  4. Lift your arm straight out in front of you.  5. Bend your arm 90 degrees at the elbow (right angle) so your forearm goes across your body and looks like the letter "L."  6. Use your other arm to gently pull the elbow forward and across your body.  7. Repeat steps 1-3 with the other arm.  Stretching Exercise 2  You will need a towel or rope for this exercise.  3. Bend one arm behind your back with the palm facing outward.  4. Hold a towel with your other hand.  5. Reach the arm that holds the towel above your head, and bend that arm at the elbow. Your wrist should be behind your neck.  6. Use your free hand to grab the free end of the towel.  7. With the higher hand, gently pull the towel up behind you.  8. With the lower hand, pull the towel down behind you.  9. Repeat steps 1-6 with the other arm.  STRENGTHENING EXERCISES  Do each of these exercises at four different times of day (sessions) every day or as told by your health care provider. To begin with, repeat each exercise 5 times (repetitions). Work toward doing 3 sets of 12 repetitions or  as told by your health care provider.  Strengthening Exercise 1  You will need a light weight for this activity. As you grow stronger, you may use a heavier weight.  4. Standing with a weight in your hand, lift your arm straight out to the side until it is at the same height as your shoulder.  5. Bend your arm at 90 degrees so that your   fingers are pointing to the ceiling.  6. Slowly raise your hand until your arm is straight up in the air.  7. Repeat steps 1-3 with the other arm.  Strengthening Exercise 2  You will need a light weight for this activity. As you grow stronger, you may use a heavier weight.  1. Standing with a weight in your hand, gradually move your straight arm in an arc, starting at your side, then out in front of you, then straight up over your head.  2. Gradually move your other arm in an arc, starting at your side, then out in front of you, then straight up over your head.  3. Repeat steps 1-2 with the other arm.  Strengthening Exercise 3  You will need an elastic band for this activity. As you grow stronger, gradually increase the size of the bands or increase the number of bands that you use at one time.  1. While standing, hold an elastic band in one hand and raise that arm up in the air.  2. With your other hand, pull down the band until that hand is by your side.  3. Repeat steps 1-2 with the other arm.     This information is not intended to replace advice given to you by your health care provider. Make sure you discuss any questions you have with your health care provider.     Document Released: 10/02/2002 Document Revised: 05/20/2014 Document Reviewed: 12/30/2013  Elsevier Interactive Patient Education ©2016 [employer]

## 2015-01-18 HISTORY — PX: FINGER SURGERY: SHX640

## 2015-01-22 ENCOUNTER — Inpatient Hospital Stay: Payer: Medicare PPO | Attending: Oncology | Admitting: Oncology

## 2015-01-22 VITALS — BP 108/76 | HR 76 | Temp 98.1°F | Resp 18 | Wt 202.8 lb

## 2015-01-22 DIAGNOSIS — Z7984 Long term (current) use of oral hypoglycemic drugs: Secondary | ICD-10-CM | POA: Diagnosis not present

## 2015-01-22 DIAGNOSIS — M8580 Other specified disorders of bone density and structure, unspecified site: Secondary | ICD-10-CM | POA: Insufficient documentation

## 2015-01-22 DIAGNOSIS — I4891 Unspecified atrial fibrillation: Secondary | ICD-10-CM | POA: Diagnosis not present

## 2015-01-22 DIAGNOSIS — Z7901 Long term (current) use of anticoagulants: Secondary | ICD-10-CM | POA: Diagnosis not present

## 2015-01-22 DIAGNOSIS — Z170 Estrogen receptor positive status [ER+]: Secondary | ICD-10-CM | POA: Insufficient documentation

## 2015-01-22 DIAGNOSIS — C50911 Malignant neoplasm of unspecified site of right female breast: Secondary | ICD-10-CM

## 2015-01-22 DIAGNOSIS — Z79899 Other long term (current) drug therapy: Secondary | ICD-10-CM | POA: Diagnosis not present

## 2015-01-22 DIAGNOSIS — Z923 Personal history of irradiation: Secondary | ICD-10-CM | POA: Insufficient documentation

## 2015-01-22 DIAGNOSIS — Z79811 Long term (current) use of aromatase inhibitors: Secondary | ICD-10-CM | POA: Insufficient documentation

## 2015-01-22 DIAGNOSIS — E1165 Type 2 diabetes mellitus with hyperglycemia: Secondary | ICD-10-CM | POA: Insufficient documentation

## 2015-01-22 NOTE — Progress Notes (Signed)
[facility]  Telephone:([phone]) [phone] Fax:(336) [phone]  ID: Lisa Crawford OB: Jun 12, 1935  MR#: [id number]  FBP#:[id number]  Patient Care Team: [place] Pink, MD as PCP - General (Family Medicine)  CHIEF COMPLAINT:  Chief Complaint  Patient presents with  . Breast Cancer    INTERVAL HISTORY: Patient returns to clinic for routine six-month follow-up. She continues to tolerate letrozole well without significant side effects. She denies any hot flashes, myalgias, or arthralgias. She has a good appetite.  She has no neurologic complaints.   She denies any recent fevers or illnesses.  She does not complain of weakness and fatigue.  She denies any bleeding or bruising.  She denies any chest pain, shortness of breath, cough, or hemoptysis.  She denies any nausea, vomiting, constipation, or diarrhea.  She denies any melena or hematochezia.  She offers no specific complaints today.  REVIEW OF SYSTEMS:   Review of Systems  Constitutional: Negative.   Musculoskeletal: Negative.     As per HPI. Otherwise, a complete review of systems is negatve.  PAST MEDICAL HISTORY: Past Medical History  Diagnosis Date  . A-fib ([place])   . Diabetes ([place])   . Breast cancer ([place]) right    2012 -radiation    PAST SURGICAL HISTORY: Past Surgical History  Procedure Laterality Date  . Appendectomy    . Vaginal hysterectomy    . Foot surgery Left   . Breast surgery Right   . Eye surgery Bilateral   . Breast biopsy Right 2012    FAMILY HISTORY: Reviewed and unchanged. No reported history of malignancy or chronic disease.     ADVANCED DIRECTIVES:    HEALTH MAINTENANCE: Social History  Substance Use Topics  . Smoking status: Never Smoker   . Smokeless tobacco: Not on file  . Alcohol Use: No     Colonoscopy:  PAP:  Bone density:  Lipid panel:  Allergies  Allergen Reactions  . Antihistamines, Chlorpheniramine-Type Other (See Comments)    Stay awake  . Antihistamines,  Diphenhydramine-Type Other (See Comments)    KEEPS AWAKE AND VERY WIRED    Current Outpatient Prescriptions  Medication Sig Dispense Refill  . acetaminophen (TYLENOL) 500 MG tablet Take 500 mg by mouth 2 (two) times daily.    Marland Kitchen amLODipine-benazepril (LOTREL) 5-40 MG per capsule Take 1 capsule by mouth 2 (two) times daily.    Marland Kitchen atenolol (TENORMIN) 25 MG tablet Take by mouth daily.    . divalproex (DEPAKOTE ER) 250 MG 24 hr tablet     . ELIQUIS 5 MG TABS tablet     . flecainide (TAMBOCOR) 50 MG tablet Take 50 mg by mouth 2 (two) times daily.    Marland Kitchen glimepiride (AMARYL) 2 MG tablet TAKE 1 TABLET BY MOUTH 2 (TWO) TIMES DAILY.    Marland Kitchen latanoprost (XALATAN) 0.005 % ophthalmic solution Place 1 drop into both eyes at bedtime.    Marland Kitchen letrozole (FEMARA) 2.5 MG tablet TAKE 1 TABLET BY MOUTH  ONCE A DAY FOR 30 DAYS 30 tablet 10  . Levothyroxine Sodium 50 MCG CAPS Take by mouth daily before breakfast.    . meloxicam (MOBIC) 15 MG tablet     . metFORMIN (GLUCOPHAGE) 500 MG tablet Take 1,000 mg by mouth 2 (two) times daily with a meal.    . Multiple Vitamin (MULTIVITAMIN) tablet Take 1 tablet by mouth daily.    . naproxen (NAPROSYN) 500 MG tablet Take 1 tablet (500 mg total) by mouth 2 (two) times daily with a meal.  20 tablet 0  . Timolol Maleate PF 0.5 % SOLN Apply 1 drop to eye every evening.     No current facility-administered medications for this visit.    OBJECTIVE: Filed Vitals:   01/22/15 1033  BP: 108/76  Pulse: 76  Temp: 98.1 F (36.7 C)  Resp: 18     Body mass index is 39.61 kg/(m^2).    ECOG FS:0 - Asymptomatic  General: Well-developed, well-nourished, no acute distress. Eyes: anicteric sclera. Breasts: Bilateral breast and axilla without lumps or masses. Lungs: Clear to auscultation bilaterally. Heart: Regular rate and rhythm. No rubs, murmurs, or gallops. Abdomen: Soft, nontender, nondistended. No organomegaly noted, normoactive bowel sounds. Musculoskeletal: No edema, cyanosis, or  clubbing. Neuro: Alert, answering all questions appropriately. Cranial nerves grossly intact. Skin: No rashes or petechiae noted. Psych: Normal affect.  LAB RESULTS:  Lab Results  Component Value Date   NA 141 01/13/2014   K 4.0 01/13/2014   CL 101 01/13/2014   CO2 29 01/13/2014   GLUCOSE 158* 01/13/2014   BUN 18 01/13/2014   CREATININE 0.84 01/13/2014   CALCIUM 9.4 01/13/2014   PROT 7.1 01/13/2014   ALBUMIN 3.6 01/13/2014   AST 13* 01/13/2014   ALT 21 01/13/2014   ALKPHOS 68 01/13/2014   BILITOT 0.4 01/13/2014   GFRNONAA >60 01/13/2014   GFRAA >60 01/13/2014    Lab Results  Component Value Date   WBC 7.2 01/13/2014   NEUTROABS 4.8 01/13/2014   HGB 14.3 01/13/2014   HCT 43.6 01/13/2014   MCV 96 01/13/2014   PLT 260 01/13/2014     STUDIES: No results found.  ASSESSMENT: Adenocarcinoma of the right breast status post lumpectomy.  ER/PR positive, HER-2 negative, stage Ia.  PLAN:    1.  Breast cancer: No evidence of disease. Continue letrozole completing in May 2017.  Her most recent mammogram on July 07, 2014 was reported as BI-RADS 2. Repeat in June 2017. Return to clinic in 6 months for routine evaluation. 2. Osteopenia:  Her most recent bone mineral density on July 07, 2014 reported a T score of -1.1, which is unchanged from previous. Repeat in June 2017. Continue calcium and vitamin D.  3. Atrial fibrillation: Continue Eliquis prescribed. 4. Hyperglycemia: Continue diabetic medications as prescribed.  Patient expressed understanding and was in agreement with this plan. She also understands that She can call clinic at any time with any questions, concerns, or complaints.   Mayra Reel, NP   01/22/2015 1:22 PM  Dr. Grayland Ormond was available for consultation and review of plan of care for this patient.  Patient was seen and evaluated independently and I agree with the assessment and plan as dictated above.

## 2015-01-22 NOTE — Progress Notes (Signed)
Patient is scheduled for hand surgery tomorrow but it may need to be rescheduled.  Does not offer any problems today.

## 2015-01-27 ENCOUNTER — Other Ambulatory Visit: Payer: Self-pay | Admitting: Orthopedic Surgery

## 2015-01-28 ENCOUNTER — Ambulatory Visit: Payer: Medicare PPO | Admitting: Oncology

## 2015-02-17 ENCOUNTER — Encounter: Payer: Self-pay | Admitting: Podiatry

## 2015-02-17 ENCOUNTER — Ambulatory Visit (INDEPENDENT_AMBULATORY_CARE_PROVIDER_SITE_OTHER): Payer: Medicare PPO | Admitting: Podiatry

## 2015-02-17 VITALS — BP 142/66 | HR 66 | Temp 99.2°F | Resp 18

## 2015-02-17 DIAGNOSIS — L84 Corns and callosities: Secondary | ICD-10-CM

## 2015-02-17 DIAGNOSIS — L539 Erythematous condition, unspecified: Secondary | ICD-10-CM

## 2015-02-17 DIAGNOSIS — M2010 Hallux valgus (acquired), unspecified foot: Secondary | ICD-10-CM | POA: Diagnosis not present

## 2015-02-17 MED ORDER — CEPHALEXIN 500 MG PO CAPS: 500.0000 mg | ORAL_CAPSULE | Freq: Three times a day (TID) | ORAL | Status: DC

## 2015-02-17 NOTE — Progress Notes (Signed)
   Subjective:    Patient ID: Lisa Crawford, female    DOB: 02-11-1935, 80 y.o.   MRN: [id number]  HPI   80 year old female presents the office of concerns of the sore on the side of her right bunion which has been ongoing for the last several weeks. She states that she had recent surgery and she was retaining fluid and she will if this is what started the area. She has noticed a small amount of redness overlying the area. Denies any drainage or pus. Assessment painful calluses to the bottom of her feet on both sides without any drainage or redness or swelling. She states that her A1c has been elevated. No other complaints at this time. She denies any systemic complaints such as fevers, chills, nausea, vomiting. No calf pain, chest pain, shortness of breath.   Review of Systems  All other systems reviewed and are negative.      Objective:   Physical Exam General: AAO x3, NAD  Dermatological:  On the medial aspect of the right first metatarsal head over on the side of the bunion is a small hyperkeratotic lesion/  Scab. Upon debridement there is no underlying ulceration, drainage or other signs of infection. There is slight erythema directly around this area without any increase in warmth, ascending cellulitis. Bilateral submetatarsal one and submetatarsal 2 hyperkeratotic lesions. Upon debridement there is no underlying ulceration, drainage or other signs of infection. No other open lesions or pre-ulcerative lesions identified bilaterally.  Vascular: Dorsalis Pedis artery and Posterior Tibial artery pedal pulses are 2/4 bilateral with immedate capillary fill time. Pedal hair growth present. No varicosities and no lower extremity edema present bilateral. There is no pain with calf compression, swelling, warmth, erythema.   Neruologic:  Gross sensation appears to be intact with Derrel Nip monofilament  Musculoskeletal:  HAV is present hammertoes. No gross boney pedal deformities bilateral. No  pain, crepitus, or limitation noted with foot and ankle range of motion bilateral. Muscular strength 5/5 in all groups tested bilateral.  Gait: Unassisted, Nonantalgic.       Assessment & Plan:   80 year old female with right first metatarsal pre-ulcerative lesion with localized erythema;  Hyperkeratotic lesions -Treatment options discussed including all alternatives, risks, and complications -Etiology of symptoms were discussed - lesion debrided on the right first metatarsal head without any complications or bleeding. Due to the erythema will start Keflex .Monitor for any clinical signs or symptoms of infection and directed to call the office immediately should any occur or go to the ER.  Offloading pads dispensed. -Hyperkeratotic lesions debrided without complications or bleeding. -Monitor for any further skin breakdown. -Follow-up as scheduled or sooner if any problems arise. In the meantime, encouraged to call the office with any questions, concerns, change in symptoms.   Celesta Gentile, DPM

## 2015-02-17 NOTE — Patient Instructions (Signed)
Monitor for any signs/symptoms of infection. Call the office immediately if any occur or go directly to the emergency room. Call with any questions/concerns.  

## 2015-03-03 ENCOUNTER — Ambulatory Visit: Payer: Medicare PPO | Admitting: Podiatry

## 2015-03-05 ENCOUNTER — Ambulatory Visit (INDEPENDENT_AMBULATORY_CARE_PROVIDER_SITE_OTHER): Payer: Medicare PPO | Admitting: Podiatry

## 2015-03-05 ENCOUNTER — Encounter: Payer: Self-pay | Admitting: Podiatry

## 2015-03-05 VITALS — BP 172/83 | HR 64 | Resp 18

## 2015-03-05 DIAGNOSIS — L84 Corns and callosities: Secondary | ICD-10-CM | POA: Diagnosis not present

## 2015-03-05 NOTE — Patient Instructions (Signed)
If not healed within 2-3 weeks please call for follow-up or sooner if needed.  Try [employer] shoes

## 2015-03-06 NOTE — Progress Notes (Signed)
Patient ID: Lisa Crawford, female   DOB: 26-Mar-1935, 80 y.o.   MRN: [id number]  Subjective: 80 year old female presents the office today for concerns of a pre-ulcerative wound on the spot of the right bunion. Also since last appointment should spelled the new area of pressure to the dorsal medial foot left posterior heel. She states that they started from wearing shoes that were rubbing. She has since modified issues. She denies any redness or drainage. She is fissure antibiotics. She states her swelling is better controlled prior to what it was previously.Denies any systemic complaints such as fevers, chills, nausea, vomiting. No acute changes since last appointment, and no other complaints at this time.   Objective: AAO x3, NAD DP/PT pulses palpable bilaterally, CRT less than 3 seconds Protective sensation intact with Simms Weinstein monofilament Moderate HAV is present. Overlying the bunion deformity of the right foot is a scab from a previous wound. There is also a very small lesion left posterior heel and right dorsal medial foot. There is no probing, undermining or tunneling. There is no surrounding erythema, ascending saline golf lessons, crepitus, malodor, drainage or pus. There is no clinical signs of infection. No tenderness overlying the areas. No areas of pinpoint bony tenderness or pain with vibratory sensation. MMT 5/5, ROM WNL. No edema, erythema, increase in warmth to bilateral lower extremities.  No open lesions or pre-ulcerative lesions.  No pain with calf compression, swelling, warmth, erythema  Assessment: Pre-ulcerative lesions 3  Plan: -All treatment options discussed with the patient including all alternatives, risks, complications.  -Recommend antibiotic ointment and a bandage overlying the area. If there is not healed within 2-3 weeks to call the office for follow-up appointment. Monitor for any clinical signs or symptoms of infection and directed to call the office  immediately should any occur or go to the ER. -Recommend against wearing shoes are causing friction and irritation. Continue follow-up with primary care physician for edema. -Patient encouraged to call the office with any questions, concerns, change in symptoms.   Celesta Gentile, DPM

## 2015-06-04 ENCOUNTER — Ambulatory Visit (INDEPENDENT_AMBULATORY_CARE_PROVIDER_SITE_OTHER): Payer: Medicare PPO | Admitting: Podiatry

## 2015-06-04 ENCOUNTER — Encounter: Payer: Self-pay | Admitting: Podiatry

## 2015-06-04 DIAGNOSIS — L84 Corns and callosities: Secondary | ICD-10-CM | POA: Diagnosis not present

## 2015-06-04 DIAGNOSIS — B351 Tinea unguium: Secondary | ICD-10-CM

## 2015-06-04 DIAGNOSIS — M79676 Pain in unspecified toe(s): Secondary | ICD-10-CM | POA: Diagnosis not present

## 2015-06-05 NOTE — Progress Notes (Signed)
Patient ID: LAKRESHIA VASKE, female   DOB: 08/08/1935, 80 y.o.   MRN: [id number]  Subjective: 80 y.o. returns the office today for painful, elongated, thickened toenails which she cannot trim herself and for painful calluses. Denies any redness or drainage around the nails. Denies any acute changes since last appointment and no new complaints today. Denies any systemic complaints such as fevers, chills, nausea, vomiting.   Objective: AAO 3, NAD DP/PT pulses palpable, CRT less than 3 seconds Protective sensation intact with Simms Weinstein monofilament, Achilles tendon reflex intact.  Nails hypertrophic, dystrophic, elongated, brittle, discolored 10. There is tenderness overlying the nails 1-5 bilaterally. There is no surrounding erythema or drainage along the nail sites. Hyperkeratotic lesion left foot submetatarsal one and bilateral submetatarsal 2. Upon debridement no underlying ulceration, drainage or other signs of infection. There is a well-circumscribed uniform color mole present on the plantar aspect the left midfoot which she states began unchanged for several years and has not grown or had any skin changes or drainage. No open lesions or pre-ulcerative lesions are identified. No other areas of tenderness bilateral lower extremities. No overlying edema, erythema, increased warmth. No pain with calf compression, swelling, warmth, erythema.  Assessment: Patient presents with symptomatic onychomycosis; hyperkerotic lesions  Plan: -Treatment options including alternatives, risks, complications were discussed -Nails sharply debrided 10 without complication/bleeding. -Hyperkeratotic lesions debrided 3 without complications or bleeding. -Discussed daily foot inspection. If there are any changes, to call the office immediately.  -Follow-up in 3 months or sooner if any problems are to arise. In the meantime, encouraged to call the office with any questions, concerns, changes  symptoms.  Celesta Gentile, DPM

## 2015-07-08 ENCOUNTER — Ambulatory Visit
Admission: RE | Admit: 2015-07-08 | Discharge: 2015-07-08 | Disposition: A | Discharge status: Home / Self Care | Payer: Medicare PPO | Source: Ambulatory Visit | Attending: Oncology | Admitting: Oncology

## 2015-07-08 ENCOUNTER — Other Ambulatory Visit: Payer: Self-pay | Admitting: Oncology

## 2015-07-08 DIAGNOSIS — C50911 Malignant neoplasm of unspecified site of right female breast: Secondary | ICD-10-CM

## 2015-07-08 DIAGNOSIS — M85852 Other specified disorders of bone density and structure, left thigh: Secondary | ICD-10-CM | POA: Insufficient documentation

## 2015-07-27 ENCOUNTER — Inpatient Hospital Stay: Payer: Medicare PPO | Attending: Oncology | Admitting: Oncology

## 2015-07-27 VITALS — BP 144/81 | Temp 99.4°F | Wt 197.3 lb

## 2015-07-27 DIAGNOSIS — Z79899 Other long term (current) drug therapy: Secondary | ICD-10-CM

## 2015-07-27 DIAGNOSIS — Z9223 Personal history of estrogen therapy: Secondary | ICD-10-CM | POA: Insufficient documentation

## 2015-07-27 DIAGNOSIS — M25512 Pain in left shoulder: Secondary | ICD-10-CM | POA: Insufficient documentation

## 2015-07-27 DIAGNOSIS — Z7984 Long term (current) use of oral hypoglycemic drugs: Secondary | ICD-10-CM | POA: Diagnosis not present

## 2015-07-27 DIAGNOSIS — E1165 Type 2 diabetes mellitus with hyperglycemia: Secondary | ICD-10-CM | POA: Insufficient documentation

## 2015-07-27 DIAGNOSIS — M8580 Other specified disorders of bone density and structure, unspecified site: Secondary | ICD-10-CM | POA: Insufficient documentation

## 2015-07-27 DIAGNOSIS — I4891 Unspecified atrial fibrillation: Secondary | ICD-10-CM | POA: Diagnosis not present

## 2015-07-27 DIAGNOSIS — Z170 Estrogen receptor positive status [ER+]: Secondary | ICD-10-CM | POA: Insufficient documentation

## 2015-07-27 DIAGNOSIS — Z7901 Long term (current) use of anticoagulants: Secondary | ICD-10-CM | POA: Diagnosis not present

## 2015-07-27 DIAGNOSIS — C50411 Malignant neoplasm of upper-outer quadrant of right female breast: Secondary | ICD-10-CM

## 2015-07-27 DIAGNOSIS — Z853 Personal history of malignant neoplasm of breast: Secondary | ICD-10-CM | POA: Diagnosis present

## 2015-07-27 NOTE — Progress Notes (Signed)
Patient ambulates without assistance, brought to exam room 9.  Patient denies pain or discomfort, vitals documented.  Medication record updated information provided by patient.

## 2015-07-27 NOTE — Progress Notes (Signed)
[place]  [phone] [phone]  ID: Lisa Crawford OB: 17-Jul-1935  MR#: [id number]  BLT#:[id number]  Patient Care Team: Maryland Pink, MD as PCP - General (Family Medicine)  CHIEF COMPLAINT: Adenocarcinoma of the right upper outer breast status post lumpectomy.  ER/PR positive, HER-2 negative, stage Ia.   INTERVAL HISTORY: Patient returns to clinic for routine six-month follow-up. She completed 5 years of letrozole in May 2017. She currently feels well and is asymptomatic.  Her only complaint today is of left shoulder pain, worse with movement. She has no neurologic complaints.   She denies any recent fevers or illnesses.  She does not complain of weakness and fatigue.  She denies any bleeding or bruising.  She denies any chest pain, shortness of breath, cough, or hemoptysis.  She denies any nausea, vomiting, constipation, or diarrhea.  She denies any melena or hematochezia.  She offers no specific complaints today.  REVIEW OF SYSTEMS:   Review of Systems  Constitutional: Negative.  Negative for fever, weight loss and malaise/fatigue.  Respiratory: Negative.  Negative for shortness of breath.   Cardiovascular: Negative.  Negative for chest pain.  Musculoskeletal: Positive for joint pain.  Neurological: Negative.  Negative for weakness.  Psychiatric/Behavioral: Negative.     As per HPI. Otherwise, a complete review of systems is negatve.  PAST MEDICAL HISTORY: Past Medical History  Diagnosis Date  . A-fib ([place])   . Diabetes ([place])   . Breast cancer ([place]) right    2012 -radiation    PAST SURGICAL HISTORY: Past Surgical History  Procedure Laterality Date  . Appendectomy    . Vaginal hysterectomy    . Foot surgery Left   . Breast surgery Right   . Eye surgery Bilateral   . Breast biopsy Right 2012  . Breast lumpectomy Right 2012    with radiation    FAMILY HISTORY: Reviewed and unchanged. No reported history of malignancy or chronic  disease.     ADVANCED DIRECTIVES:    HEALTH MAINTENANCE: Social History  Substance Use Topics  . Smoking status: Never Smoker   . Smokeless tobacco: Not on file  . Alcohol Use: No     Colonoscopy:  PAP:  Bone density:  Lipid panel:  Allergies  Allergen Reactions  . Antihistamines, Chlorpheniramine-Type Other (See Comments)    Stay awake  . Antihistamines, Diphenhydramine-Type Other (See Comments)    KEEPS AWAKE AND VERY WIRED    Current Outpatient Prescriptions  Medication Sig Dispense Refill  . acetaminophen (TYLENOL) 500 MG tablet Take 500 mg by mouth 2 (two) times daily.    Marland Kitchen amLODipine-benazepril (LOTREL) 5-40 MG per capsule Take 1 capsule by mouth 2 (two) times daily.    Marland Kitchen atenolol (TENORMIN) 25 MG tablet Take by mouth daily.    . cephALEXin (KEFLEX) 500 MG capsule Take 1 capsule (500 mg total) by mouth 3 (three) times daily. 30 capsule 0  . divalproex (DEPAKOTE ER) 250 MG 24 hr tablet     . ELIQUIS 5 MG TABS tablet     . flecainide (TAMBOCOR) 50 MG tablet Take 50 mg by mouth 2 (two) times daily.    Marland Kitchen glimepiride (AMARYL) 2 MG tablet TAKE 1 TABLET BY MOUTH 2 (TWO) TIMES DAILY.    Marland Kitchen latanoprost (XALATAN) 0.005 % ophthalmic solution Place 1 drop into both eyes at bedtime.    . Levothyroxine Sodium 50 MCG CAPS Take by mouth daily before breakfast.    . meloxicam (MOBIC) 15 MG tablet     .  metFORMIN (GLUCOPHAGE) 500 MG tablet Take 1,000 mg by mouth 2 (two) times daily with a meal.    . Multiple Vitamin (MULTIVITAMIN) tablet Take 1 tablet by mouth daily.    . naproxen (NAPROSYN) 500 MG tablet Take 1 tablet (500 mg total) by mouth 2 (two) times daily with a meal. 20 tablet 0  . Timolol Maleate PF 0.5 % SOLN Apply 1 drop to eye every evening.     No current facility-administered medications for this visit.    OBJECTIVE: Filed Vitals:   07/27/15 1447  BP: 144/81  Temp: 99.4 F (37.4 C)     Body mass index is 38.53 kg/(m^2).    ECOG FS:0 - Asymptomatic  General:  Well-developed, well-nourished, no acute distress. Eyes: anicteric sclera. Breasts: Bilateral breast and axilla without lumps or masses. Lungs: Clear to auscultation bilaterally. Heart: Regular rate and rhythm. No rubs, murmurs, or gallops. Abdomen: Soft, nontender, nondistended. No organomegaly noted, normoactive bowel sounds. Musculoskeletal: No edema, cyanosis, or clubbing. Neuro: Alert, answering all questions appropriately. Cranial nerves grossly intact. Skin: No rashes or petechiae noted. Psych: Normal affect.  LAB RESULTS:  Lab Results  Component Value Date   NA 141 01/13/2014   K 4.0 01/13/2014   CL 101 01/13/2014   CO2 29 01/13/2014   GLUCOSE 158* 01/13/2014   BUN 18 01/13/2014   CREATININE 0.84 01/13/2014   CALCIUM 9.4 01/13/2014   PROT 7.1 01/13/2014   ALBUMIN 3.6 01/13/2014   AST 13* 01/13/2014   ALT 21 01/13/2014   ALKPHOS 68 01/13/2014   BILITOT 0.4 01/13/2014   GFRNONAA >60 01/13/2014   GFRAA >60 01/13/2014    Lab Results  Component Value Date   WBC 7.2 01/13/2014   NEUTROABS 4.8 01/13/2014   HGB 14.3 01/13/2014   HCT 43.6 01/13/2014   MCV 96 01/13/2014   PLT 260 01/13/2014     STUDIES: Dg Bone Density  07/08/2015  EXAM: DUAL X-RAY ABSORPTIOMETRY (DXA) FOR BONE MINERAL DENSITY IMPRESSION: Dear Dr Grayland Ormond, Your patient Lisa Crawford completed a BMD test on 07/08/2015 using the [facility] (analysis version: 14.10) manufactured by [employer]. The following summarizes the results of our evaluation. PATIENT BIOGRAPHICAL: Name: Lisa Crawford, Lisa Crawford Patient ID: [id number] Birth Date: 12-23-1935 Height: 60.0 in. Gender: Female Exam Date: 07/08/2015 Weight: 200.0 lbs. Indications: Advanced Age, Caucasian, History of Breast Cancer, History of Fracture (Adult), Hypothyroid, Hysterectomy, Postmenopausal Fractures: Ankle Treatments: femara, LEVOTHYROXINE, Multi-Vitamin with calcium ASSESSMENT: The BMD measured at Femur Neck Left is 0.884 g/cm2 with a T-score of  -1.1. This patient is considered osteopenic according to [facility]) criteria. L-3 was excluded due to degenerative changes. This patient is not a candidate for FRAX due to Femara. Site Region Measured Measured WHO Young Adult BMD Date       Age      Classification T-score AP Spine L1-L4 (L3) 07/08/2015 77.8 Normal 1.8 1.386 g/cm2 AP Spine L1-L4 (L3) 07/07/2014 76.8 Normal 1.8 1.382 g/cm2 AP Spine L1-L4 (L3) 07/03/2013 75.8 Normal 1.4 1.353 g/cm2 AP Spine L1-L4 (L3) 07/19/2012 74.9 Normal 1.7 1.391 g/cm2 DualFemur Neck Left 07/08/2015 77.8 Osteopenia -1.1 0.884 g/cm2 DualFemur Neck Left 07/07/2014 76.8 Normal -0.9 0.912 g/cm2 DualFemur Neck Left 07/03/2013 75.8 Normal -0.9 0.910 g/cm2 DualFemur Neck Left 07/19/2012 74.9 Normal -0.9 0.910 g/cm2 DualFemur Neck Left 07/19/2011 73.9 Osteopenia -1.1 0.888 g/cm2 DualFemur Neck Left 06/24/2010 72.8 Normal -0.6 0.950 g/cm2 World Health Organization [facility]) criteria for post-menopausal, Caucasian Women: Normal:  T-score at or above -1 SD Osteopenia:   T-score between -1 and -2.5 SD Osteoporosis: T-score at or below -2.5 SD RECOMMENDATIONS: [place] recommends that FDA-approved medical therapies be considered in postmenopausal women and men age 2 or older with a: 1. Hip or vertebral (clinical or morphometric) fracture. 2. T-score of < -2.5 at the spine or hip. 3. Ten-year fracture probability by FRAX of 3% or greater for hip fracture or 20% or greater for major osteoporotic fracture. All treatment decisions require clinical judgment and consideration of individual patient factors, including patient preferences, co-morbidities, previous drug use, risk factors not captured in the FRAX model (e.g. falls, vitamin D deficiency, increased bone turnover, interval significant decline in bone density) and possible under - or over-estimation of fracture risk by FRAX. All patients should ensure an adequate intake of dietary calcium (1200  mg/d) and vitamin D (800 IU daily) unless contraindicated. FOLLOW-UP: People with diagnosed cases of osteoporosis or at high risk for fracture should have regular bone mineral density tests. For patients eligible for Medicare, routine testing is allowed once every 2 years. The testing frequency can be increased to one year for patients who have rapidly progressing disease, those who are receiving or discontinuing medical therapy to restore bone mass, or have additional risk factors. I have reviewed this report, and agree with the above findings. [facility] Radiology Electronically Signed   By: David  Martinique M.D.   On: 07/08/2015 10:50   Mm Diag Breast Tomo Bilateral  07/08/2015  CLINICAL DATA:  History of right breast cancer in 2012 status post lumpectomy and radiation therapy. EXAM: 2D DIGITAL DIAGNOSTIC BILATERAL MAMMOGRAM WITH CAD AND ADJUNCT TOMO COMPARISON:  Previous exam(s). ACR Breast Density Category a: The breast tissue is almost entirely fatty. FINDINGS: There are stable postsurgical changes within the right breast. Biopsy site marker within the left breast is stable in position. There are no new dominant masses, suspicious calcifications or secondary signs of malignancy within either breast. Mammographic images were processed with CAD. IMPRESSION: No evidence of malignancy within either breast. Stable postsurgical changes within the right breast. RECOMMENDATION: Bilateral diagnostic mammogram in 1 year. I have discussed the findings and recommendations with the patient. Results were also provided in writing at the conclusion of the visit. If applicable, a reminder letter will be sent to the patient regarding the next appointment. BI-RADS CATEGORY  2: Benign. Electronically Signed   By: Franki Cabot M.D.   On: 07/08/2015 10:56    ASSESSMENT: Adenocarcinoma of the right upper outer breast status post lumpectomy.  ER/PR positive, HER-2 negative, stage Ia.  PLAN:    1.  Adenocarcinoma of the right  upper outer breast status post lumpectomy.  ER/PR positive, HER-2 negative, stage Ia: No evidence of disease. Patient has now completed 5 years of letrozole and cannot be transitioned to yearly visits.  Her most recent mammogram on July 08, 2015 was reported as BI-RADS 2. Repeat in June 2018.  2. Osteopenia:  Her most recent bone mineral density on July 08, 2015 reported a T score of -1.1, which is unchanged from previous. Continue calcium and vitamin D. Now the patient has discontinued letrozole, her bone mineral densities can be monitored by her primary care physician. 3. Atrial fibrillation: Continue Eliquis prescribed. 4. Hyperglycemia: Continue diabetic medications as prescribed.  Patient expressed understanding and was in agreement with this plan. She also understands that She can call clinic at any time with any questions, concerns, or complaints.   Lloyd Huger,  MD   07/27/2015 3:12 PM

## 2015-09-10 ENCOUNTER — Ambulatory Visit: Payer: Medicare PPO | Admitting: Podiatry

## 2015-10-15 ENCOUNTER — Ambulatory Visit (INDEPENDENT_AMBULATORY_CARE_PROVIDER_SITE_OTHER): Payer: Medicare PPO | Admitting: Podiatry

## 2015-10-15 ENCOUNTER — Ambulatory Visit: Payer: Medicare PPO | Admitting: Podiatry

## 2015-10-15 ENCOUNTER — Encounter: Payer: Self-pay | Admitting: Podiatry

## 2015-10-15 DIAGNOSIS — B351 Tinea unguium: Secondary | ICD-10-CM | POA: Diagnosis not present

## 2015-10-15 DIAGNOSIS — M79676 Pain in unspecified toe(s): Secondary | ICD-10-CM | POA: Diagnosis not present

## 2015-10-15 DIAGNOSIS — M722 Plantar fascial fibromatosis: Secondary | ICD-10-CM

## 2015-10-15 NOTE — Progress Notes (Signed)
Patient ID: Lisa Crawford, female   DOB: 1935-09-17, 80 y.o.   MRN: [id number]  Subjective: 80 y.o. returns the office today for painful, elongated, thickened toenails which she cannot trim herself and for painful calluses. Denies any redness or drainage around the nails. Also states that she has been having some pain to both of her heels to be in the day. She denies any swelling or redness. No numbness or tingling to the area and she denies any recent injury or trauma. The pain has not went up at night. She's had no previous treatment for this. Denies any acute changes since last appointment and no new complaints today. Denies any systemic complaints such as fevers, chills, nausea, vomiting.   Objective: AAO 3, NAD DP/PT pulses palpable, CRT less than 3 seconds Protective sensation intact with Simms Weinstein monofilament, Achilles tendon reflex intact.  Nails hypertrophic, dystrophic, elongated, brittle, discolored 10. There is tenderness overlying the nails 1-5 bilaterally. There is no surrounding erythema or drainage along the nail sites. There is mild tenderness palpation of plantar medial tubercle of the calcaneus at insertion of plantar fascia. Is no pain along the course the arch of the foot. There is atrophy of the inferior heel fat pad. lateral compression of calcaneus. no overlying edema, erythema, increase in warmth. no other areas of tenderness bilaterally. There is a well-circumscribed uniform color mole present on the plantar aspect the left midfoot which she states began unchanged for several years and has not grown or had any skin changes or drainage. No open lesions or pre-ulcerative lesions are identified. No other areas of tenderness bilateral lower extremities. No overlying edema, erythema, increased warmth. No pain with calf compression, swelling, warmth, erythema.  Assessment: Patient presents with symptomatic onychomycosis; heel pain likely plantar  fasciitis  Plan: -Treatment options including alternatives, risks, complications were discussed -Nails sharply debrided 10 without complication/bleeding. -Regards the heel pain likely biomechanical changes results and plantar fasciitis. Discussed stretching, icing exercises daily as well as supportive shoe gear and I also discuss orthotics. She will start with this. If symptoms continue over the next couple weeks I'll see her back in 3-4 weeks at that time x-rays will be taken and possible steroid injection. She agrees this plan. She did not want to get x-rays today. -Discussed daily foot inspection. If there are any changes, to call the office immediately.  -Follow-up in 3 months or sooner if any problems are to arise. In the meantime, encouraged to call the office with any questions, concerns, changes symptoms.  Celesta Gentile, DPM  Celesta Gentile, DPM

## 2015-10-15 NOTE — Patient Instructions (Signed)

## 2015-10-23 DIAGNOSIS — M722 Plantar fascial fibromatosis: Secondary | ICD-10-CM

## 2015-11-12 ENCOUNTER — Ambulatory Visit: Payer: Medicare PPO | Admitting: Podiatry

## 2015-12-16 ENCOUNTER — Ambulatory Visit (INDEPENDENT_AMBULATORY_CARE_PROVIDER_SITE_OTHER): Payer: Medicare PPO

## 2015-12-16 ENCOUNTER — Encounter: Payer: Self-pay | Admitting: Podiatry

## 2015-12-16 ENCOUNTER — Ambulatory Visit (INDEPENDENT_AMBULATORY_CARE_PROVIDER_SITE_OTHER): Payer: Medicare PPO | Admitting: Podiatry

## 2015-12-16 DIAGNOSIS — S92354A Nondisplaced fracture of fifth metatarsal bone, right foot, initial encounter for closed fracture: Secondary | ICD-10-CM | POA: Diagnosis not present

## 2015-12-16 DIAGNOSIS — M722 Plantar fascial fibromatosis: Secondary | ICD-10-CM | POA: Diagnosis not present

## 2015-12-16 DIAGNOSIS — R52 Pain, unspecified: Secondary | ICD-10-CM

## 2015-12-16 DIAGNOSIS — M79671 Pain in right foot: Secondary | ICD-10-CM

## 2015-12-16 NOTE — Progress Notes (Signed)
She presents today with chief complaint of pain times last few days to the right foot. Ace that she noticed a twisting motion which resulted in pain. She also has pain to palpation medially continue tubercle right. She says is bothering her for the past few days.  Objective: Vital signs are stable alert and oriented 3. Pulses are palpable. She is pain on palpation mucogingival right heel. She also has pain on palpation fifth metatarsal base right. It egressed 3 views right foot demonstrates osseous immature individual with a fifth metatarsal fracture nondisplaced non-comminuted and small avulsion fracture as well. This is not a Jones fracture.  Assessment: Plantar fasciitis. Closed fracture nondisplaced fracture fifth metatarsal right foot.  Plan: Injected her right heel today with Kenalog and local anesthetic and place her in cam walker. Follow up with her in 1 month for another set of x-rays.

## 2016-01-13 ENCOUNTER — Ambulatory Visit (INDEPENDENT_AMBULATORY_CARE_PROVIDER_SITE_OTHER): Payer: Medicare PPO

## 2016-01-13 ENCOUNTER — Ambulatory Visit (INDEPENDENT_AMBULATORY_CARE_PROVIDER_SITE_OTHER): Payer: Medicare PPO | Admitting: Podiatry

## 2016-01-13 ENCOUNTER — Encounter: Payer: Self-pay | Admitting: Podiatry

## 2016-01-13 DIAGNOSIS — S92354D Nondisplaced fracture of fifth metatarsal bone, right foot, subsequent encounter for fracture with routine healing: Secondary | ICD-10-CM | POA: Diagnosis not present

## 2016-01-13 DIAGNOSIS — B351 Tinea unguium: Secondary | ICD-10-CM

## 2016-01-13 DIAGNOSIS — M722 Plantar fascial fibromatosis: Secondary | ICD-10-CM

## 2016-01-13 DIAGNOSIS — M79676 Pain in unspecified toe(s): Secondary | ICD-10-CM

## 2016-01-13 NOTE — Progress Notes (Signed)
She presents today for follow-up of her fractured fifth metatarsal base of the right foot she states that since been wearing the boot it really hasn't hurt very much. She does complain of right heel pain however and she is concerned about long toenails.  Objective: Vital signs are stable she's alert and oriented 3. Pulses are palpable. Toenails are thick yellow dystrophic onychomycotic and painful on palpation as well as debridement she also has pain on palpation medial continue to go the right heel. Initially she has no pain on palpation of the fifth metatarsal base of the right foot until I get to the peroneal tendon where it is inserting on the base of the fifth met and it is mildly tender at that point. Radiographs taken today do demonstrate the same findings as of last time along the fifth metatarsal base.  Assessment: Avulsion fracture the fifth metatarsal base right foot. Plantar fascitis right foot. Pain in limb segment onychomycosis.  Plan: I injected the right heel today with Kenalog and local anesthetic request that she continue to wear her CAM boot for at least another 2 weeks and also debrided her toenails 1 through 5 bilateral.

## 2016-01-14 ENCOUNTER — Ambulatory Visit: Payer: Medicare PPO | Admitting: Podiatry

## 2016-02-08 ENCOUNTER — Emergency Department: Payer: Medicare PPO

## 2016-02-08 ENCOUNTER — Emergency Department
Admission: EM | Admit: 2016-02-08 | Discharge: 2016-02-08 | Disposition: A | Discharge status: Home / Self Care | Payer: Medicare PPO | Attending: Emergency Medicine | Admitting: Emergency Medicine

## 2016-02-08 ENCOUNTER — Encounter: Payer: Self-pay | Admitting: Emergency Medicine

## 2016-02-08 DIAGNOSIS — E119 Type 2 diabetes mellitus without complications: Secondary | ICD-10-CM | POA: Insufficient documentation

## 2016-02-08 DIAGNOSIS — Z79899 Other long term (current) drug therapy: Secondary | ICD-10-CM | POA: Insufficient documentation

## 2016-02-08 DIAGNOSIS — S161XXA Strain of muscle, fascia and tendon at neck level, initial encounter: Secondary | ICD-10-CM | POA: Insufficient documentation

## 2016-02-08 DIAGNOSIS — W01198A Fall on same level from slipping, tripping and stumbling with subsequent striking against other object, initial encounter: Secondary | ICD-10-CM | POA: Insufficient documentation

## 2016-02-08 DIAGNOSIS — Y999 Unspecified external cause status: Secondary | ICD-10-CM | POA: Insufficient documentation

## 2016-02-08 DIAGNOSIS — Y929 Unspecified place or not applicable: Secondary | ICD-10-CM | POA: Insufficient documentation

## 2016-02-08 DIAGNOSIS — R51 Headache: Secondary | ICD-10-CM | POA: Diagnosis not present

## 2016-02-08 DIAGNOSIS — Z853 Personal history of malignant neoplasm of breast: Secondary | ICD-10-CM | POA: Diagnosis not present

## 2016-02-08 DIAGNOSIS — E039 Hypothyroidism, unspecified: Secondary | ICD-10-CM | POA: Insufficient documentation

## 2016-02-08 DIAGNOSIS — Y9389 Activity, other specified: Secondary | ICD-10-CM | POA: Insufficient documentation

## 2016-02-08 DIAGNOSIS — Z7984 Long term (current) use of oral hypoglycemic drugs: Secondary | ICD-10-CM | POA: Diagnosis not present

## 2016-02-08 DIAGNOSIS — W19XXXA Unspecified fall, initial encounter: Secondary | ICD-10-CM

## 2016-02-08 DIAGNOSIS — S199XXA Unspecified injury of neck, initial encounter: Secondary | ICD-10-CM | POA: Diagnosis present

## 2016-02-08 MED ORDER — LIDOCAINE 5 % EX PTCH
1.0000 | MEDICATED_PATCH | CUTANEOUS | Status: DC
  Administered 2016-02-08: 1 via TRANSDERMAL
  Filled 2016-02-08: qty 1

## 2016-02-08 MED ORDER — LIDOCAINE 5 % EX PTCH: 1.0000 | MEDICATED_PATCH | CUTANEOUS | 0 refills | Status: AC

## 2016-02-08 NOTE — ED Provider Notes (Signed)
[facility] Emergency Department Provider Note  ____________________________________________  Time seen: Approximately 11:38 AM  I have reviewed the triage vital signs and the nursing notes.   HISTORY  Chief Complaint Fall    HPI Lisa Crawford is a 81 y.o. female that presents to the emergency department with neck pain after falling backwards on black ice yesterday. Patient states that she hit back of her head on concrete. Patient states that she then crawled to another car to help stand. Patient states that her knee has been hurting since crawling. Patient has neck pain on both sides of neck. He does not radiate. Pain is worse with turning head. Patient has had an occasional frontal headache on and off since incident. Patient denies any additional injuries. Patient did not lose consciousness.  Patient has been taking Tylenol for pain, which is helping. She is on blood thinners. Patient denies fever, numbness, tingling, nausea, vomiting.  Past Medical History:  Diagnosis Date  . A-fib ([place])   . Breast cancer ([place]) right   2012 -radiation  . Diabetes [facility])     Patient Active Problem List   Diagnosis Date Noted  . Absolute anemia 07/17/2014  . A-fib ([place]) 07/17/2014  . Cataract 07/17/2014  . Diabetes ([place]) 07/17/2014  . Glaucoma 07/17/2014  . Cephalalgia 07/17/2014  . BP (high blood pressure) 07/17/2014  . Adult hypothyroidism 07/17/2014  . Cannot sleep 07/17/2014  . Arthritis, degenerative 07/17/2014  . Allergic rhinitis, seasonal 07/17/2014  . Herpes zona 07/17/2014  . Breast cancer of upper-outer quadrant of right female breast ([place]) 01/17/2010    Past Surgical History:  Procedure Laterality Date  . APPENDECTOMY    . BREAST BIOPSY Right 2012  . BREAST LUMPECTOMY Right 2012   with radiation  . BREAST SURGERY Right   . EYE SURGERY Bilateral   . FOOT SURGERY Left   . VAGINAL HYSTERECTOMY      Prior to Admission medications   Medication Sig  Start Date End Date Taking? Authorizing Provider  acetaminophen (TYLENOL) 500 MG tablet Take 500 mg by mouth 2 (two) times daily.    Historical Provider, MD  amLODipine-benazepril (LOTREL) 5-40 MG per capsule Take 1 capsule by mouth 2 (two) times daily.    Historical Provider, MD  atenolol (TENORMIN) 25 MG tablet Take by mouth daily.    Historical Provider, MD  divalproex (DEPAKOTE ER) 250 MG 24 hr tablet  12/31/14   Historical Provider, MD  ELIQUIS 5 MG TABS tablet  12/25/14   Historical Provider, MD  flecainide (TAMBOCOR) 50 MG tablet Take 50 mg by mouth 2 (two) times daily.    Historical Provider, MD  glimepiride (AMARYL) 2 MG tablet TAKE 1 TABLET BY MOUTH 2 (TWO) TIMES DAILY. 02/24/14   Historical Provider, MD  latanoprost (XALATAN) 0.005 % ophthalmic solution Place 1 drop into both eyes at bedtime.    Historical Provider, MD  Levothyroxine Sodium 50 MCG CAPS Take by mouth daily before breakfast.    Historical Provider, MD  lidocaine (LIDODERM) 5 % Place 1 patch onto the skin daily. Remove & Discard patch within 12 hours or as directed by MD 02/08/16 02/07/17  Laban Emperor, PA-C  meloxicam [facility]) 15 MG tablet  12/23/14   Historical Provider, MD  metFORMIN (GLUCOPHAGE) 500 MG tablet Take 1,000 mg by mouth 2 (two) times daily with a meal.    Historical Provider, MD  Multiple Vitamin (MULTIVITAMIN) tablet Take 1 tablet by mouth daily.    Historical Provider, MD  naproxen (NAPROSYN) 500 MG tablet Take 1 tablet (500 mg total) by mouth 2 (two) times daily with a meal. 12/10/14   Carrie Mew, MD  Timolol Maleate PF 0.5 % SOLN Apply 1 drop to eye every evening.    Historical Provider, MD    Allergies Antihistamines, chlorpheniramine-type and Antihistamines, diphenhydramine-type  Family History  Problem Relation Age of Onset  . Breast cancer Neg Hx     Social History Social History  Substance Use Topics  . Smoking status: Never Smoker  . Smokeless tobacco: Not on file  . Alcohol use No      Review of Systems  Constitutional: No fever/chills ENT: No upper respiratory complaints. Cardiovascular: No chest pain. Respiratory:  No SOB. Gastrointestinal: No abdominal pain.  No nausea, no vomiting.  Skin: Negative for rash, abrasions, lacerations, ecchymosis. Neurological: Negative for numbness or tingling   ____________________________________________   PHYSICAL EXAM:  VITAL SIGNS: ED Triage Vitals  Enc Vitals Group     BP 02/08/16 1053 117/75     Pulse Rate 02/08/16 1051 61     Resp 02/08/16 1051 18     Temp 02/08/16 1051 97.8 F (36.6 C)     Temp Source 02/08/16 1051 Oral     SpO2 02/08/16 1051 98 %     Weight 02/08/16 1051 192 lb (87.1 kg)     Height 02/08/16 1051 5\' 1"  (1.549 m)     Head Circumference --      Peak Flow --      Pain Score 02/08/16 1051 5     Pain Loc --      Pain Edu? --      Excl. in [place]? --      Constitutional: Alert and oriented. Well appearing and in no acute distress. Eyes: Conjunctivae are normal. PERRL. EOMI. Head: Atraumatic. ENT:      Ears:      Nose: No congestion/rhinnorhea.      Mouth/Throat: Mucous membranes are moist.  Neck: No stridor.  No cervical spine tenderness to palpation.Tenderness to palpation over trapezius muscle. Full range of motion of neck. Cardiovascular: Normal rate, regular rhythm.  Good peripheral circulation. Respiratory: Normal respiratory effort without tachypnea or retractions. Lungs CTAB. Good air entry to the bases with no decreased or absent breath sounds. Musculoskeletal: Full range of motion to all extremities. No gross deformities appreciated. No tenderness to palpation over either knee.  Neurologic:  Normal speech and language. No gross focal neurologic deficits are appreciated.  Skin:  Skin is warm, dry and intact. No rash noted. Psychiatric: Mood and affect are normal. Speech and behavior are normal. Patient exhibits appropriate insight and  judgement.   ____________________________________________   LABS (all labs ordered are listed, but only abnormal results are displayed)  Labs Reviewed - No data to display ____________________________________________  EKG   ____________________________________________  RADIOLOGY  Ct Head Wo Contrast  Result Date: 02/08/2016 CLINICAL DATA:  Headache and neck pain since a slip and fall on ice yesterday. Initial encounter. EXAM: CT HEAD WITHOUT CONTRAST CT CERVICAL SPINE WITHOUT CONTRAST TECHNIQUE: Multidetector CT imaging of the head and cervical spine was performed following the standard protocol without intravenous contrast. Multiplanar CT image reconstructions of the cervical spine were also generated. COMPARISON:  None. FINDINGS: CT HEAD FINDINGS Brain: There is mild atrophy without hemorrhage, infarct, mass lesion, mass effect, midline shift or abnormal extra-axial fluid collection. No hydrocephalus or pneumocephalus. Vascular: Atherosclerotic calcifications are identified. Skull: Intact. Sinuses/Orbits: Negative. Other: None. CT CERVICAL SPINE FINDINGS  Alignment: Trace anterolisthesis C4 on C5 due to facet arthropathy is noted. Skull base and vertebrae: No acute fracture. No primary bone lesion or focal pathologic process. Soft tissues and spinal canal: No prevertebral fluid or swelling. No visible canal hematoma. Disc levels: The C4-5 disc appears autologously fused. Multilevel facet arthropathy is seen. Upper chest: Clear. Other: None. IMPRESSION: No acute abnormality head or cervical spine. Cortical atrophy. Atherosclerosis. Cervical spondylosis. Electronically Signed   By: Inge Rise M.D.   On: 02/08/2016 11:24   Ct Cervical Spine Wo Contrast  Result Date: 02/08/2016 CLINICAL DATA:  Headache and neck pain since a slip and fall on ice yesterday. Initial encounter. EXAM: CT HEAD WITHOUT CONTRAST CT CERVICAL SPINE WITHOUT CONTRAST TECHNIQUE: Multidetector CT imaging of the head and  cervical spine was performed following the standard protocol without intravenous contrast. Multiplanar CT image reconstructions of the cervical spine were also generated. COMPARISON:  None. FINDINGS: CT HEAD FINDINGS Brain: There is mild atrophy without hemorrhage, infarct, mass lesion, mass effect, midline shift or abnormal extra-axial fluid collection. No hydrocephalus or pneumocephalus. Vascular: Atherosclerotic calcifications are identified. Skull: Intact. Sinuses/Orbits: Negative. Other: None. CT CERVICAL SPINE FINDINGS Alignment: Trace anterolisthesis C4 on C5 due to facet arthropathy is noted. Skull base and vertebrae: No acute fracture. No primary bone lesion or focal pathologic process. Soft tissues and spinal canal: No prevertebral fluid or swelling. No visible canal hematoma. Disc levels: The C4-5 disc appears autologously fused. Multilevel facet arthropathy is seen. Upper chest: Clear. Other: None. IMPRESSION: No acute abnormality head or cervical spine. Cortical atrophy. Atherosclerosis. Cervical spondylosis. Electronically Signed   By: Inge Rise M.D.   On: 02/08/2016 11:24    ____________________________________________    PROCEDURES  Procedure(s) performed:    Procedures    Medications  lidocaine (LIDODERM) 5 % 1 patch (1 patch Transdermal Patch Applied 02/08/16 1158)     ____________________________________________   INITIAL IMPRESSION / ASSESSMENT AND PLAN / ED COURSE  Pertinent labs & imaging results that were available during my care of the patient were reviewed by me and considered in my medical decision making (see chart for details).  Review of the [place] CSRS was performed in accordance of the [facility] prior to dispensing any controlled drugs.     Patient's diagnosis is consistent with muscle strain after fall. Vital signs and exam are reassuring. CT head and neck are negative for any acute processes. Patient was given lidocaine patch in emergency department.  Patient will be discharged home with prescriptions for lidocaine patch. Patient is to follow up with PCP as directed. Patient is given ED precautions to return to the ED for any worsening or new symptoms.     ____________________________________________  FINAL CLINICAL IMPRESSION(S) / ED DIAGNOSES  Final diagnoses:  Fall, initial encounter      NEW MEDICATIONS STARTED DURING THIS VISIT:  Discharge Medication List as of 02/08/2016 12:07 PM    START taking these medications   Details  lidocaine (LIDODERM) 5 % Place 1 patch onto the skin daily. Remove & Discard patch within 12 hours or as directed by MD, Starting Mon 02/08/2016, Until Tue 02/07/2017, Print            This chart was dictated using voice recognition software/Dragon. Despite best efforts to proofread, errors can occur which can change the meaning. Any change was purely unintentional.    Laban Emperor, PA-C 02/08/16 1336    Lavonia Drafts, MD 02/08/16 1400

## 2016-02-08 NOTE — ED Notes (Signed)
Pt via wheelchair from [facility], fell yesterday struck posterior scalp , on blood thinners, elbow and hip pain , pt alert and oriented x4 , c/o frontal headache

## 2016-02-08 NOTE — ED Notes (Signed)
See triage note  States she fell on ice yesterday  Hit head   No loc  conts to have frontal headache

## 2016-02-08 NOTE — ED Notes (Signed)
Patient gone to ct , they will bring her to room 52

## 2016-02-08 NOTE — ED Triage Notes (Signed)
Pt reports slipping on ice yesterday and hitting back of head, denies LOC, pt is on eliquis. Pt reports pain to neck, left elbow and left knee.

## 2016-04-13 ENCOUNTER — Ambulatory Visit (INDEPENDENT_AMBULATORY_CARE_PROVIDER_SITE_OTHER): Payer: Medicare PPO | Admitting: Podiatry

## 2016-04-13 ENCOUNTER — Encounter: Payer: Self-pay | Admitting: Podiatry

## 2016-04-13 DIAGNOSIS — M722 Plantar fascial fibromatosis: Secondary | ICD-10-CM

## 2016-04-13 DIAGNOSIS — M79676 Pain in unspecified toe(s): Secondary | ICD-10-CM

## 2016-04-13 DIAGNOSIS — B351 Tinea unguium: Secondary | ICD-10-CM

## 2016-04-13 NOTE — Patient Instructions (Signed)

## 2016-04-14 NOTE — Progress Notes (Signed)
She presents today with chief complaint of painful elongated toenails bilaterally. She is also complaining of a painful right heel. Denies any trauma.  Objective: Vital signs are stable and oriented 3. Pulses are strongly palpable. She has pain on palpation medial calcaneal tubercle of the right heel. Her tenderness along thick yellow dystrophic onychomycotic.   assessment: Pain in limb secondary to onychomycosis. Plantar fasciitis right foot.  Plan: Discussed etiology and pathology consider surgical therapy debrided toenails 1 through 5 bilateral service secondary to pain and onychomycosis. Also injected the right heel today with a long local anesthetic. We also dispensed a plantar fascia brace.

## 2016-07-11 ENCOUNTER — Ambulatory Visit (INDEPENDENT_AMBULATORY_CARE_PROVIDER_SITE_OTHER): Payer: Medicare PPO | Admitting: Podiatry

## 2016-07-11 ENCOUNTER — Ambulatory Visit
Admission: RE | Admit: 2016-07-11 | Discharge: 2016-07-11 | Disposition: A | Discharge status: Home / Self Care | Payer: Medicare PPO | Source: Ambulatory Visit | Attending: Oncology | Admitting: Oncology

## 2016-07-11 ENCOUNTER — Encounter: Payer: Self-pay | Admitting: Podiatry

## 2016-07-11 DIAGNOSIS — C50411 Malignant neoplasm of upper-outer quadrant of right female breast: Secondary | ICD-10-CM | POA: Insufficient documentation

## 2016-07-11 DIAGNOSIS — B351 Tinea unguium: Secondary | ICD-10-CM

## 2016-07-11 DIAGNOSIS — M79676 Pain in unspecified toe(s): Secondary | ICD-10-CM

## 2016-07-11 HISTORY — DX: Personal history of irradiation: Z92.3

## 2016-07-11 LAB — HM MAMMOGRAPHY: HM Mammogram: NORMAL (ref 0–4)

## 2016-07-11 NOTE — Progress Notes (Signed)
Complaint:  Visit Type: Patient returns to my office for continued preventative foot care services. Complaint: Patient states" my nails have grown long and thick and become painful to walk and wear shoes" Patient says her plantar fascitis right heel has improved.. The patient presents for preventative foot care services. No changes to ROS  Podiatric Exam: Vascular: dorsalis pedis and posterior tibial pulses are palpable bilateral. Capillary return is immediate. Temperature gradient is WNL. Skin turgor WNL  Sensorium: Normal Semmes Weinstein monofilament test. Normal tactile sensation bilaterally. Nail Exam: Pt has thick disfigured discolored nails with subungual debris noted bilateral entire nail hallux through fifth toenails Ulcer Exam: There is no evidence of ulcer or pre-ulcerative changes or infection. Orthopedic Exam: Muscle tone and strength are WNL. No limitations in general ROM. No crepitus or effusions noted. Foot type and digits show no abnormalities. Bony prominences are unremarkable. Skin: No Porokeratosis. No infection or ulcers  Diagnosis:  Onychomycosis, , Pain in right toe, pain in left toes  Treatment & Plan Procedures and Treatment: Consent by patient was obtained for treatment procedures. The patient understood the discussion of treatment and procedures well. All questions were answered thoroughly reviewed. Debridement of mycotic and hypertrophic toenails, 1 through 5 bilateral and clearing of subungual debris. No ulceration, no infection noted.  Return Visit-Office Procedure: Patient instructed to return to the office for a follow up visit 3 months for continued evaluation and treatment.    Kajuana Shareef DPM 

## 2016-07-26 NOTE — Progress Notes (Signed)
[place]  [phone] [phone]) [phone]  ID: Lisa Crawford OB: 01/27/1935  MR#: [id number]  NGE#:[id number]  Patient Care Team: [place] Pink, MD as PCP - General (Family Medicine)  CHIEF COMPLAINT: Adenocarcinoma of the right upper outer breast status post lumpectomy.  ER/PR positive, HER-2 negative, stage Ia.   INTERVAL HISTORY: Patient returns to clinic for routine six-month follow-up. She completed 5 years of letrozole in May 2017. She is to feel well and is asymptomatic. She has no neurologic complaints. She has a "spot" on her left wrist and left leg that she is concerned about.   She denies any recent fevers or illnesses.  She does not complain of weakness and fatigue.  She denies any bleeding or bruising.  She denies any chest pain, shortness of breath, cough, or hemoptysis.  She denies any nausea, vomiting, constipation, or diarrhea.  She denies any melena or hematochezia.  She offers no specific complaints today.  REVIEW OF SYSTEMS:   Review of Systems  Constitutional: Negative.  Negative for fever, malaise/fatigue and weight loss.  Respiratory: Negative.  Negative for cough and shortness of breath.   Cardiovascular: Negative.  Negative for chest pain and leg swelling.  Gastrointestinal: Negative.  Negative for abdominal pain.  Genitourinary: Negative.   Musculoskeletal: Positive for joint pain.  Skin: Positive for rash. Negative for itching.  Neurological: Negative.  Negative for weakness.  Psychiatric/Behavioral: Negative.  The patient is not nervous/anxious.     As per HPI. Otherwise, a complete review of systems is negatve.  PAST MEDICAL HISTORY: Past Medical History:  Diagnosis Date  . A-fib ([place])   . Breast cancer ([place]) 2012   RT LUMPECTOMY  . Diabetes ([place])   . Personal history of radiation therapy 2012   BREAST CA    PAST SURGICAL HISTORY: Past Surgical History:  Procedure Laterality Date  . APPENDECTOMY    . BREAST  BIOPSY Right 2012   POS  . BREAST BIOPSY Left 2013   NEG  . BREAST LUMPECTOMY Right 2012   BREAST CA  . BREAST SURGERY Right   . EYE SURGERY Bilateral   . FOOT SURGERY Left   . VAGINAL HYSTERECTOMY      FAMILY HISTORY: Reviewed and unchanged. No reported history of malignancy or chronic disease.     ADVANCED DIRECTIVES:    HEALTH MAINTENANCE: Social History  Substance Use Topics  . Smoking status: Never Smoker  . Smokeless tobacco: Never Used  . Alcohol use No     Colonoscopy:  PAP:  Bone density:  Lipid panel:  Allergies  Allergen Reactions  . Antihistamines, Chlorpheniramine-Type Other (See Comments)    Stay awake  . Antihistamines, Diphenhydramine-Type Other (See Comments)    KEEPS AWAKE AND VERY WIRED  . Clemastine Other (See Comments)    KEEPS AWAKE AND VERY WIRED    Current Outpatient Prescriptions  Medication Sig Dispense Refill  . acetaminophen (TYLENOL) 500 MG tablet Take 500 mg by mouth 2 (two) times daily.    Marland Kitchen amLODipine-benazepril (LOTREL) 5-40 MG per capsule Take 1 capsule by mouth 2 (two) times daily.    Marland Kitchen atenolol (TENORMIN) 25 MG tablet Take by mouth daily.    . divalproex (DEPAKOTE ER) 250 MG 24 hr tablet     . ELIQUIS 5 MG TABS tablet     . flecainide (TAMBOCOR) 50 MG tablet Take 50 mg by mouth 2 (two) times daily.    Marland Kitchen glimepiride (AMARYL) 2 MG tablet TAKE 1 TABLET BY MOUTH  2 (TWO) TIMES DAILY.    Marland Kitchen latanoprost (XALATAN) 0.005 % ophthalmic solution Place 1 drop into both eyes at bedtime.    . Levothyroxine Sodium 50 MCG CAPS Take by mouth daily before breakfast.    . lidocaine (LIDODERM) 5 % Place 1 patch onto the skin daily. Remove & Discard patch within 12 hours or as directed by MD 10 patch 0  . meloxicam (MOBIC) 15 MG tablet     . metFORMIN (GLUCOPHAGE) 500 MG tablet Take 1,000 mg by mouth 2 (two) times daily with a meal.    . Multiple Vitamin (MULTIVITAMIN) tablet Take 1 tablet by mouth daily.    . naproxen (NAPROSYN) 500 MG tablet Take  1 tablet (500 mg total) by mouth 2 (two) times daily with a meal. 20 tablet 0  . Timolol Maleate PF 0.5 % SOLN Apply 1 drop to eye every evening.     No current facility-administered medications for this visit.     OBJECTIVE: Vitals:   07/28/16 1432  BP: 130/80  Pulse: 69  Resp: 18  Temp: 98.5 F (36.9 C)     Body mass index is 37.28 kg/m.    ECOG FS:0 - Asymptomatic  General: Well-developed, well-nourished, no acute distress. Eyes: anicteric sclera. Breasts: Bilateral breast and axilla without lumps or masses. Lungs: Clear to auscultation bilaterally. Heart: Regular rate and rhythm. No rubs, murmurs, or gallops. Abdomen: Soft, nontender, nondistended. No organomegaly noted, normoactive bowel sounds. Musculoskeletal: No edema, cyanosis, or clubbing. Neuro: Alert, answering all questions appropriately. Cranial nerves grossly intact. Skin: Left wrist and left leg with what appears to be an insect bite Psych: Normal affect.  LAB RESULTS:  Lab Results  Component Value Date   NA 141 01/13/2014   K 4.0 01/13/2014   CL 101 01/13/2014   CO2 29 01/13/2014   GLUCOSE 158 (H) 01/13/2014   BUN 18 01/13/2014   CREATININE 0.84 01/13/2014   CALCIUM 9.4 01/13/2014   PROT 7.1 01/13/2014   ALBUMIN 3.6 01/13/2014   AST 13 (L) 01/13/2014   ALT 21 01/13/2014   ALKPHOS 68 01/13/2014   BILITOT 0.4 01/13/2014   GFRNONAA >60 01/13/2014   GFRAA >60 01/13/2014    Lab Results  Component Value Date   WBC 7.2 01/13/2014   NEUTROABS 4.8 01/13/2014   HGB 14.3 01/13/2014   HCT 43.6 01/13/2014   MCV 96 01/13/2014   PLT 260 01/13/2014     STUDIES: Mm Diag Breast Tomo Bilateral  Result Date: 07/11/2016 CLINICAL DATA:  Patient with history of right breast cancer diagnosed in 2012. EXAM: 2D DIGITAL DIAGNOSTIC BILATERAL MAMMOGRAM WITH CAD AND ADJUNCT TOMO COMPARISON:  Previous exam(s). ACR Breast Density Category b: There are scattered areas of fibroglandular density. FINDINGS: Stable  postlumpectomy changes right breast. No concerning masses, calcifications or nonsurgical distortion identified within either breast. Mammographic images were processed with CAD. IMPRESSION: No mammographic evidence for malignancy. RECOMMENDATION: Bilateral diagnostic mammography 1 year. I have discussed the findings and recommendations with the patient. Results were also provided in writing at the conclusion of the visit. If applicable, a reminder letter will be sent to the patient regarding the next appointment. BI-RADS CATEGORY  2: Benign. Electronically Signed   By: Lovey Newcomer M.D.   On: 07/11/2016 10:30    ASSESSMENT: Adenocarcinoma of the right upper outer breast status post lumpectomy.  ER/PR positive, HER-2 negative, stage Ia.  PLAN:    1.  Adenocarcinoma of the right upper outer breast status post lumpectomy.  ER/PR  positive, HER-2 negative, stage Ia: No evidence of disease. Patient has now completed 5 years of letrozole.  Her most recent mammogram on July 11, 2016 was reported as BI-RADS 2. Repeat in June 2019. Return to clinic in 1 year for routine evaluation. 2. Osteopenia:  Her most recent bone mineral density on July 08, 2015 reported a T score of -1.1, which is unchanged from previous. Continue calcium and vitamin D. Now the patient has discontinued letrozole, her bone mineral densities can be monitored by her primary care physician. 3. Atrial fibrillation: Continue Eliquis prescribed. 4. Hyperglycemia: Continue diabetic medications as prescribed. 5. Insect bite: Patient has been instructed to monitor the area closely, but no other interventions are needed at this time.  Patient expressed understanding and was in agreement with this plan. She also understands that She can call clinic at any time with any questions, concerns, or complaints.   Lloyd Huger, MD   07/31/2016 7:40 AM

## 2016-07-28 ENCOUNTER — Inpatient Hospital Stay: Payer: Medicare PPO | Attending: Oncology | Admitting: Oncology

## 2016-07-28 VITALS — BP 130/80 | HR 69 | Temp 98.5°F | Resp 18 | Wt 197.3 lb

## 2016-07-28 DIAGNOSIS — E1165 Type 2 diabetes mellitus with hyperglycemia: Secondary | ICD-10-CM

## 2016-07-28 DIAGNOSIS — R21 Rash and other nonspecific skin eruption: Secondary | ICD-10-CM | POA: Diagnosis not present

## 2016-07-28 DIAGNOSIS — Z7901 Long term (current) use of anticoagulants: Secondary | ICD-10-CM | POA: Diagnosis not present

## 2016-07-28 DIAGNOSIS — I4891 Unspecified atrial fibrillation: Secondary | ICD-10-CM

## 2016-07-28 DIAGNOSIS — S60862A Insect bite (nonvenomous) of left wrist, initial encounter: Secondary | ICD-10-CM | POA: Diagnosis not present

## 2016-07-28 DIAGNOSIS — Z853 Personal history of malignant neoplasm of breast: Secondary | ICD-10-CM

## 2016-07-28 DIAGNOSIS — Z7984 Long term (current) use of oral hypoglycemic drugs: Secondary | ICD-10-CM | POA: Diagnosis not present

## 2016-07-28 DIAGNOSIS — Z9223 Personal history of estrogen therapy: Secondary | ICD-10-CM | POA: Insufficient documentation

## 2016-07-28 DIAGNOSIS — C50411 Malignant neoplasm of upper-outer quadrant of right female breast: Secondary | ICD-10-CM

## 2016-07-28 DIAGNOSIS — M8580 Other specified disorders of bone density and structure, unspecified site: Secondary | ICD-10-CM | POA: Insufficient documentation

## 2016-07-28 DIAGNOSIS — Z170 Estrogen receptor positive status [ER+]: Secondary | ICD-10-CM | POA: Insufficient documentation

## 2016-07-28 DIAGNOSIS — Y9389 Activity, other specified: Secondary | ICD-10-CM | POA: Insufficient documentation

## 2016-07-28 DIAGNOSIS — Z79899 Other long term (current) drug therapy: Secondary | ICD-10-CM | POA: Insufficient documentation

## 2016-07-28 DIAGNOSIS — Z923 Personal history of irradiation: Secondary | ICD-10-CM | POA: Insufficient documentation

## 2016-07-28 DIAGNOSIS — S80862A Insect bite (nonvenomous), left lower leg, initial encounter: Secondary | ICD-10-CM | POA: Insufficient documentation

## 2016-07-28 NOTE — Progress Notes (Signed)
Patient is here for follow up, she has a spot on her left leg and left wrist.

## 2016-08-18 IMAGING — MG MM DIGITAL DIAGNOSTIC BILAT W/ TOMO W/ CAD
8 of 13 series · 8 of 29 positions shown · non-contrast
Comparison: Previous exam(s).

ACR Breast Density Category a: The breast tissue is almost entirely
fatty.

CLINICAL DATA: History of right breast cancer in 8688 status post
lumpectomy and radiation therapy.

EXAM:
2D DIGITAL DIAGNOSTIC BILATERAL MAMMOGRAM WITH CAD AND ADJUNCT TOMO

[R MLO (1 of 2)]
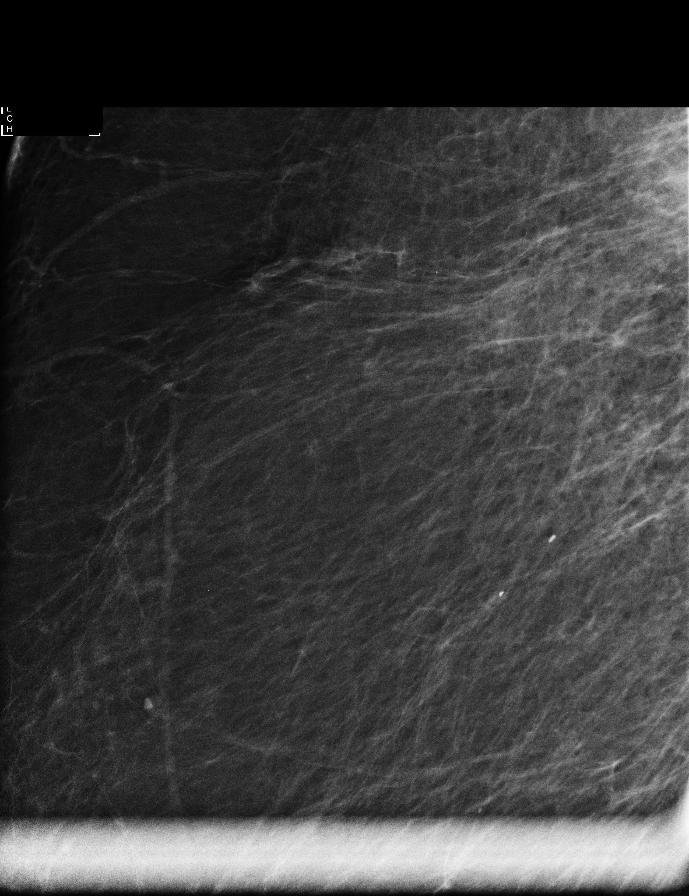

[R CC synth-2D]
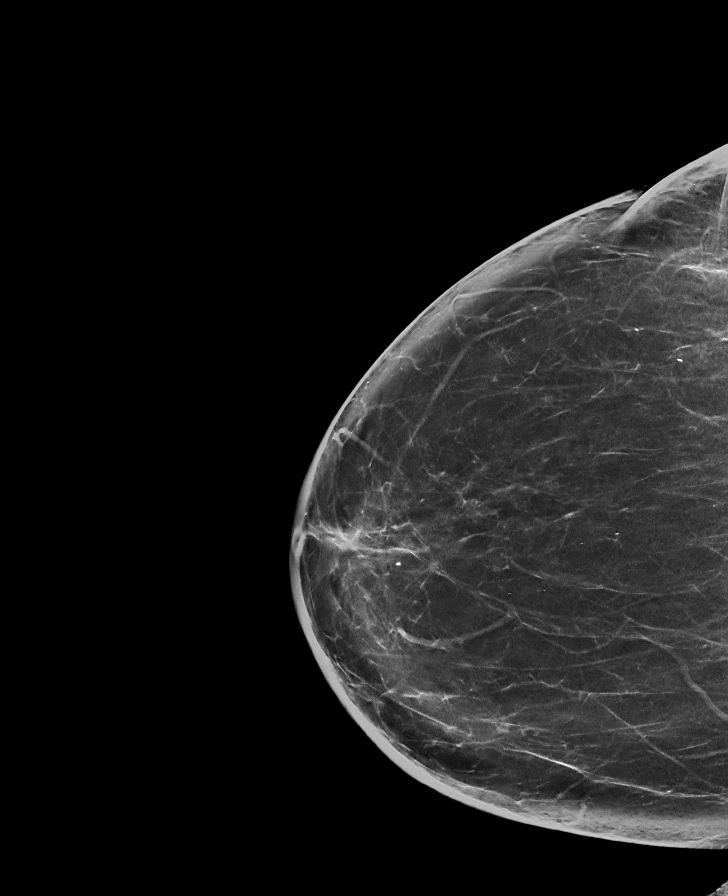

[R MLO (2 of 2)]
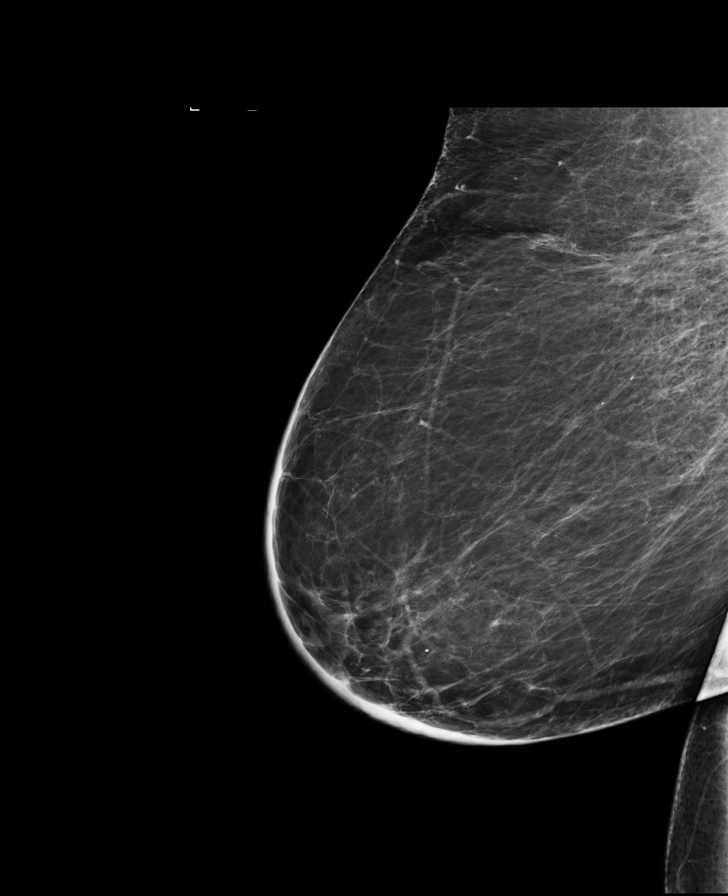

[L MLO]
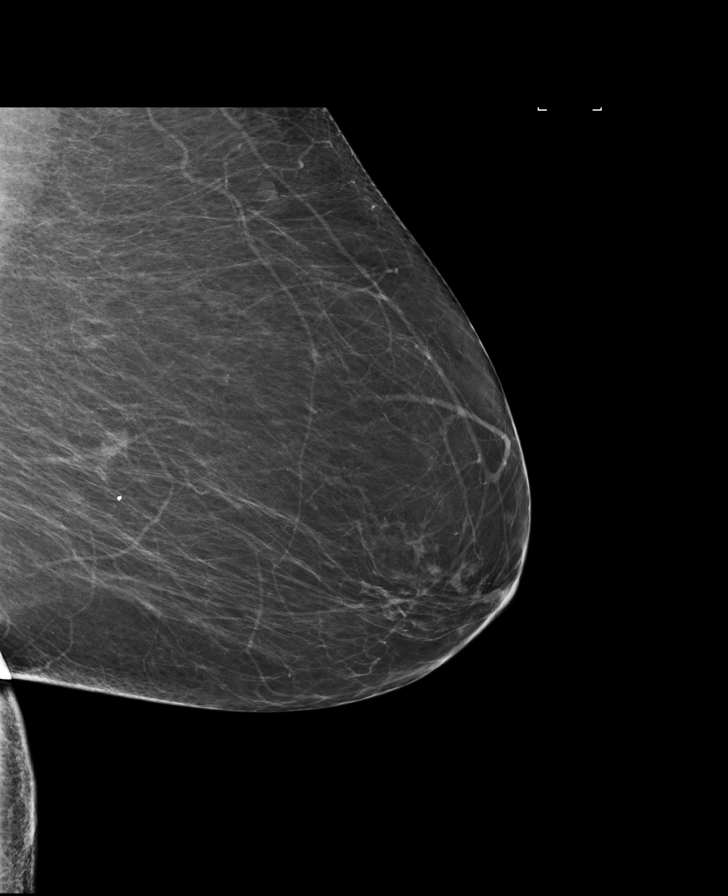

[R CC]
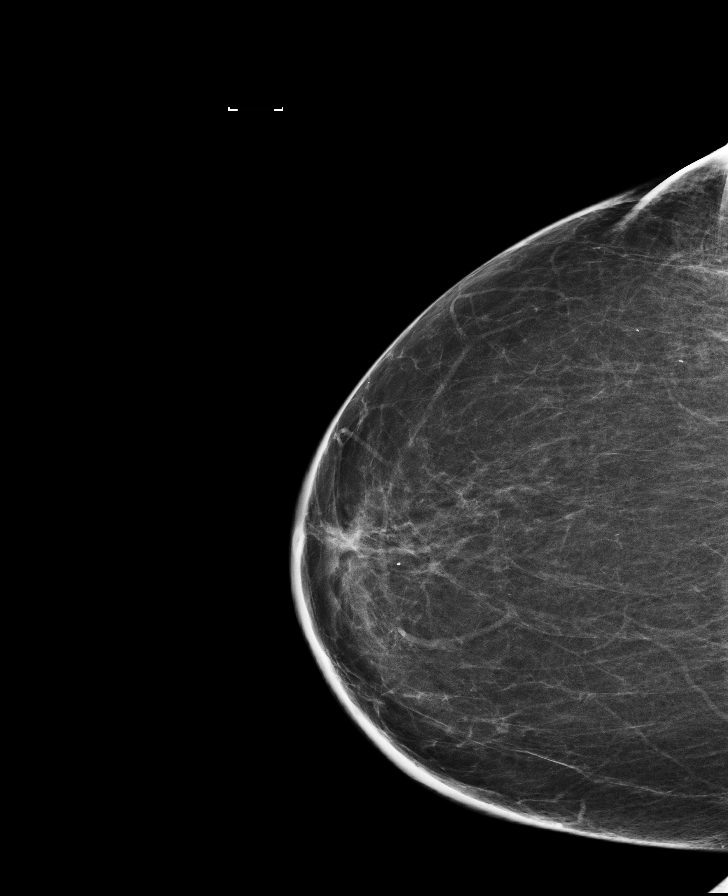

[L CC]
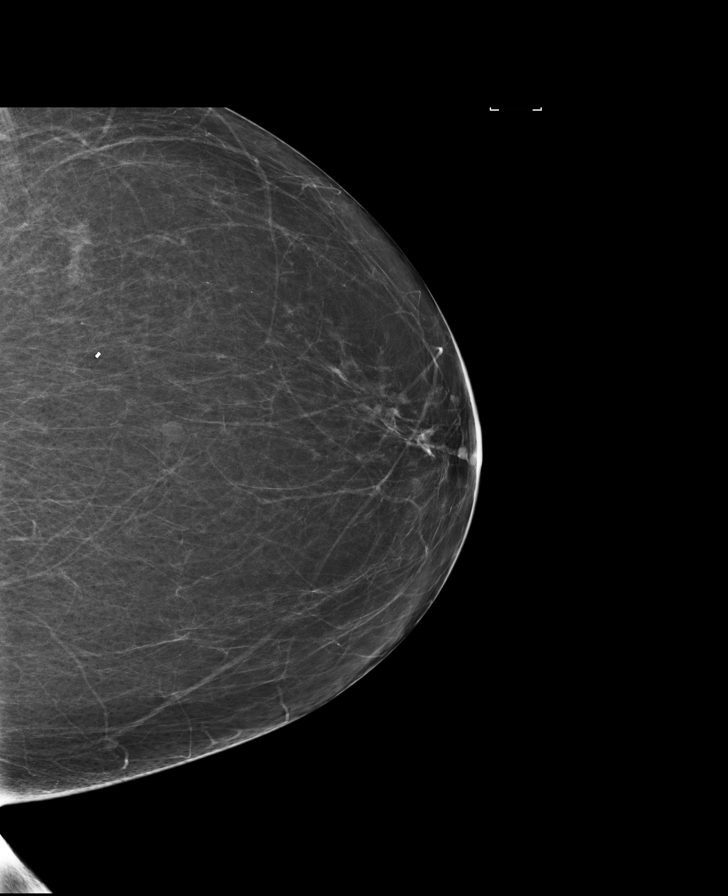

[L MLO synth-2D]
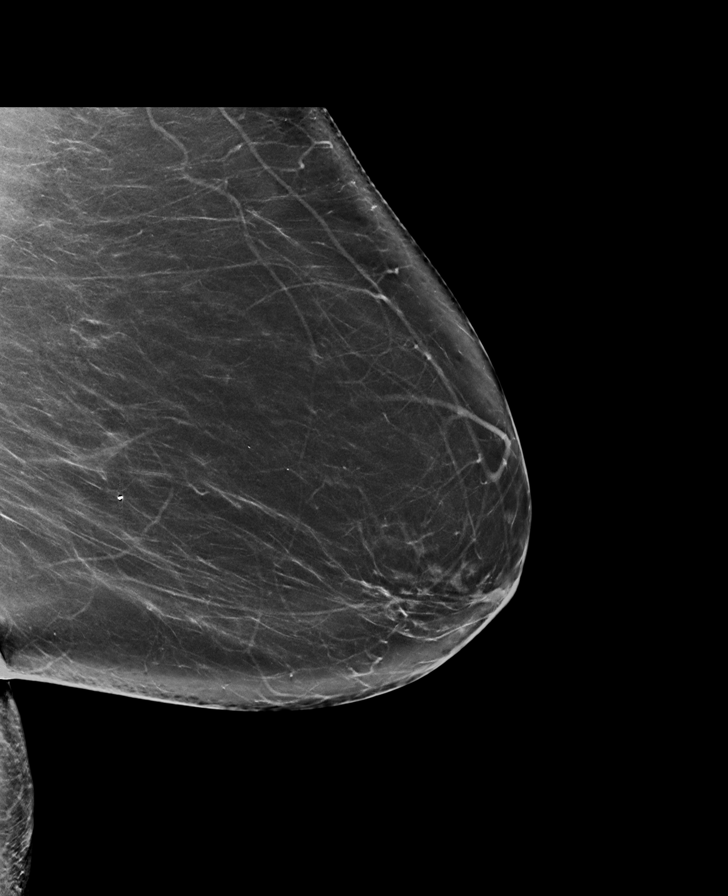

[L CC synth-2D]
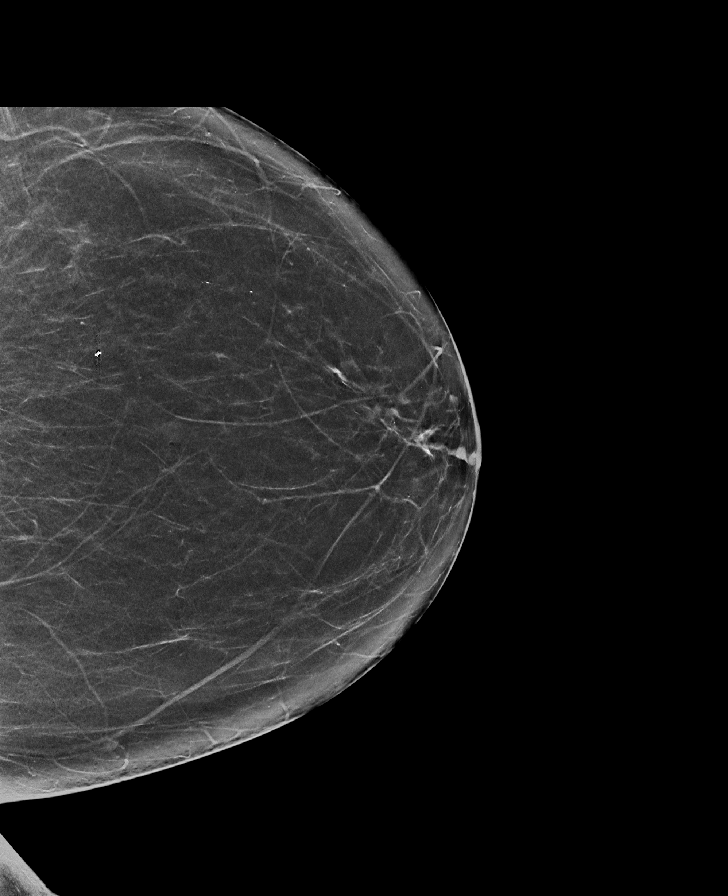

[8 of 29 positions shown; findings below may reference images not displayed]

FINDINGS: There are stable postsurgical changes within the right breast.
Biopsy site marker within the left breast is stable in position.
There are no new dominant masses, suspicious calcifications or
secondary signs of malignancy within either breast.

Mammographic images were processed with CAD.
IMPRESSION: No evidence of malignancy within either breast. Stable postsurgical
changes within the right breast.

RECOMMENDATION:
Bilateral diagnostic mammogram in 1 year.

I have discussed the findings and recommendations with the patient.
Results were also provided in writing at the conclusion of the
visit. If applicable, a reminder letter will be sent to the patient
regarding the next appointment.

BI-RADS CATEGORY  2: Benign.

## 2016-09-08 ENCOUNTER — Ambulatory Visit (INDEPENDENT_AMBULATORY_CARE_PROVIDER_SITE_OTHER): Payer: Medicare PPO | Admitting: Advanced Practice Midwife

## 2016-09-08 ENCOUNTER — Encounter: Payer: Self-pay | Admitting: Advanced Practice Midwife

## 2016-09-08 VITALS — BP 126/68 | HR 68 | Ht 61.0 in | Wt 198.0 lb

## 2016-09-08 DIAGNOSIS — Z124 Encounter for screening for malignant neoplasm of cervix: Secondary | ICD-10-CM

## 2016-09-08 DIAGNOSIS — Z01419 Encounter for gynecological examination (general) (routine) without abnormal findings: Secondary | ICD-10-CM

## 2016-09-08 NOTE — Progress Notes (Signed)
Patient ID: Lisa Crawford, female   DOB: 1935-06-29, 81 y.o.   MRN: [id number]     Gynecology Annual Exam  PCP: Maryland Pink, MD  Chief Complaint:  Chief Complaint  Patient presents with  . Gynecologic Exam    History of Present Illness:Patient is a 81 y.o. G2P2 presents for annual exam. The patient has no complaints today.   LMP: No LMP recorded. Patient has had a hysterectomy. Postcoital Bleeding: not applicable Dysmenorrhea: not applicable   The patient is not sexually active. She denies dyspareunia.  The patient does perform self breast exams.  There is no notable family history of breast or ovarian cancer in her family.  The patient wears seatbelts: yes.   The patient has regular exercise: yes.  She admits to trying to eat healthy but could improve. She drinks diet cola.  The patient denies current symptoms of depression.     Review of Systems: Review of Systems  Constitutional: Negative.   HENT: Negative.   Eyes: Negative.   Respiratory: Negative.   Cardiovascular: Negative.   Gastrointestinal: Negative.   Genitourinary: Negative.   Musculoskeletal: Negative.   Skin: Negative.   Neurological: Negative.   Endo/Heme/Allergies: Negative.   Psychiatric/Behavioral: Negative.     Past Medical History:  Past Medical History:  Diagnosis Date  . A-fib ([place])   . Breast cancer ([place]) 2012   RT LUMPECTOMY  . Diabetes ([place])   . Personal history of radiation therapy 2012   BREAST CA    Past Surgical History:  Past Surgical History:  Procedure Laterality Date  . APPENDECTOMY    . BREAST BIOPSY Right 2012   POS  . BREAST BIOPSY Left 2013   NEG  . BREAST LUMPECTOMY Right 2012   BREAST CA  . BREAST SURGERY Right   . EYE SURGERY Bilateral   . FINGER SURGERY  01/2015  . FOOT SURGERY Left   . VAGINAL HYSTERECTOMY      Gynecologic History:  No LMP recorded. Patient has had a hysterectomy. Last Pap: 2 years ago Results were:  no abnormalities  Last mammogram:  2 months ago Results were: BI-RAD I Obstetric History: G2P2  Family History:  Family History  Problem Relation Age of Onset  . Breast cancer Neg Hx     Social History:  Social History   Social History  . Marital status: Widowed    Spouse name: N/A  . Number of children: N/A  . Years of education: N/A   Occupational History  . Not on file.   Social History Main Topics  . Smoking status: Never Smoker  . Smokeless tobacco: Never Used  . Alcohol use No  . Drug use: No  . Sexual activity: Not Currently   Other Topics Concern  . Not on file   Social History Narrative  . No narrative on file    Allergies:  Allergies  Allergen Reactions  . Antihistamines, Chlorpheniramine-Type Other (See Comments)    Stay awake  . Antihistamines, Diphenhydramine-Type Other (See Comments)    KEEPS AWAKE AND VERY WIRED  . Clemastine Other (See Comments)    KEEPS AWAKE AND VERY WIRED    Medications: Prior to Admission medications   Medication Sig Start Date End Date Taking? Authorizing Provider  acetaminophen (TYLENOL) 500 MG tablet Take 500 mg by mouth 2 (two) times daily.   Yes [provider]  amLODipine-benazepril (LOTREL) 5-40 MG per capsule Take 1 capsule by mouth 2 (two) times daily.   Yes [provider]  atenolol (TENORMIN) 25 MG tablet Take by mouth daily.   Yes [provider]  divalproex (DEPAKOTE ER) 250 MG 24 hr tablet  12/31/14  Yes [provider]  ELIQUIS 5 MG TABS tablet  12/25/14  Yes [provider]  flecainide (TAMBOCOR) 50 MG tablet Take 50 mg by mouth 2 (two) times daily.   Yes [provider]  glimepiride (AMARYL) 2 MG tablet TAKE 1 TABLET BY MOUTH 2 (TWO) TIMES DAILY. 02/24/14  Yes [provider]  latanoprost (XALATAN) 0.005 % ophthalmic solution Place 1 drop into both eyes at bedtime.   Yes [provider]  Levothyroxine Sodium 50 MCG CAPS Take by mouth daily before breakfast.   Yes [provider]  lidocaine (LIDODERM) 5 % Place 1 patch onto the skin daily. Remove & Discard patch within 12 hours or as directed by MD 02/08/16 02/07/17 Yes Laban Emperor, PA-C  meloxicam [facility]) 15 MG tablet  12/23/14  Yes [provider]  metFORMIN (GLUCOPHAGE) 500 MG tablet Take 1,000 mg by mouth 2 (two) times daily with a meal.   Yes [provider]  Multiple Vitamin (MULTIVITAMIN) tablet Take 1 tablet by mouth daily.   Yes [provider]  naproxen (NAPROSYN) 500 MG tablet Take 1 tablet (500 mg total) by mouth 2 (two) times daily with a meal. 12/10/14  Yes Carrie Mew, MD  Timolol Maleate PF 0.5 % SOLN Apply 1 drop to eye every evening.   Yes [provider]  TRADJENTA 5 MG TABS tablet  08/22/16   [provider]    Physical Exam Vitals: Blood pressure 126/68, pulse 68, height 5\' 1"  (1.549 m), weight 198 lb (89.8 kg).  General: NAD HEENT: normocephalic, anicteric Thyroid: no enlargement, no palpable nodules Pulmonary: No increased work of breathing, CTAB Cardiovascular: RRR, distal pulses 2+ Breast: Breast symmetrical, no tenderness, no palpable nodules or masses, no skin or nipple retraction present, no nipple discharge.  No axillary or supraclavicular lymphadenopathy. Abdomen: NABS, soft, non-tender, non-distended.  Umbilicus without lesions.  No hepatomegaly, splenomegaly or masses palpable. No evidence of hernia  Genitourinary:  External: Normal external female genitalia.  Normal urethral meatus, normal  Bartholin's and Skene's glands.    Vagina: Normal vaginal mucosa, no evidence of prolapse.    Cervix: Grossly normal in appearance, no bleeding, no CMT  Uterus: Non-enlarged, mobile, normal contour.    Adnexa: ovaries non-enlarged, no adnexal masses  Rectal: deferred  Lymphatic: no evidence of inguinal lymphadenopathy Extremities: no edema, erythema, or tenderness Neurologic: Grossly intact Psychiatric: mood appropriate, affect  full    Assessment: 81 y.o. G2P2 routine annual exam  Plan: Problem List Items Addressed This Visit    None      1) Mammogram - recommend yearly screening mammogram.  Mammogram Is up to date  2) Continue healthy lifestyle exercise and increase healthy diet  3) ASCCP guidelines and rational discussed.  Patient opts for every 3 years screening interval  4) Osteoporosis: patient is due for screening next year - per USPTF routine screening DEXA at age 59   5) Routine healthcare maintenance including cholesterol, diabetes screening discussed managed by PCP  6) Colonoscopy: patient is due for screening next year.  Screening recommended starting at age 80 for average risk individuals, age 53 for individuals deemed at increased risk (including African Americans) and recommended to continue until age 83.  For patient age 79-85 individualized approach is recommended.  Gold standard screening is via colonoscopy, Cologuard screening  is an acceptable alternative for patient unwilling or unable to undergo colonoscopy.  "Colorectal cancer screening for average?risk adults: 2018 guideline update from the American Cancer Society"CA: A Cancer Journal for Clinicians: Jun 15, 2016   7) Follow up 1 year for routine annual  Rod Can, [place]

## 2016-09-12 LAB — PAP IG (IMAGE GUIDED): PAP Smear Comment: 0

## 2016-10-10 ENCOUNTER — Ambulatory Visit (INDEPENDENT_AMBULATORY_CARE_PROVIDER_SITE_OTHER): Payer: Medicare PPO | Admitting: Podiatry

## 2016-10-10 ENCOUNTER — Ambulatory Visit: Payer: Medicare PPO | Admitting: Podiatry

## 2016-10-10 DIAGNOSIS — M79676 Pain in unspecified toe(s): Secondary | ICD-10-CM

## 2016-10-10 DIAGNOSIS — B351 Tinea unguium: Secondary | ICD-10-CM | POA: Diagnosis not present

## 2016-10-10 NOTE — Progress Notes (Signed)
Complaint:  Visit Type: Patient returns to my office for continued preventative foot care services. Complaint: Patient states" my nails have grown long and thick and become painful to walk and wear shoes" Patient says her plantar fascitis right heel has improved.. The patient presents for preventative foot care services. No changes to ROS  Podiatric Exam: Vascular: dorsalis pedis and posterior tibial pulses are palpable bilateral. Capillary return is immediate. Temperature gradient is WNL. Skin turgor WNL  Sensorium: Normal Semmes Weinstein monofilament test. Normal tactile sensation bilaterally. Nail Exam: Pt has thick disfigured discolored nails with subungual debris noted bilateral entire nail hallux through fifth toenails Ulcer Exam: There is no evidence of ulcer or pre-ulcerative changes or infection. Orthopedic Exam: Muscle tone and strength are WNL. No limitations in general ROM. No crepitus or effusions noted. Foot type and digits show no abnormalities. Bony prominences are unremarkable. Skin: No Porokeratosis. No infection or ulcers  Diagnosis:  Onychomycosis, , Pain in right toe, pain in left toes  Treatment & Plan Procedures and Treatment: Consent by patient was obtained for treatment procedures. The patient understood the discussion of treatment and procedures well. All questions were answered thoroughly reviewed. Debridement of mycotic and hypertrophic toenails, 1 through 5 bilateral and clearing of subungual debris. No ulceration, no infection noted.  Return Visit-Office Procedure: Patient instructed to return to the office for a follow up visit 3 months for continued evaluation and treatment.    Gardiner Barefoot DPM

## 2017-01-16 ENCOUNTER — Encounter: Payer: Self-pay | Admitting: Podiatry

## 2017-01-16 ENCOUNTER — Ambulatory Visit (INDEPENDENT_AMBULATORY_CARE_PROVIDER_SITE_OTHER): Payer: Medicare PPO | Admitting: Podiatry

## 2017-01-16 DIAGNOSIS — B351 Tinea unguium: Secondary | ICD-10-CM

## 2017-01-16 DIAGNOSIS — D689 Coagulation defect, unspecified: Secondary | ICD-10-CM

## 2017-01-16 DIAGNOSIS — E119 Type 2 diabetes mellitus without complications: Secondary | ICD-10-CM

## 2017-01-16 DIAGNOSIS — M79676 Pain in unspecified toe(s): Secondary | ICD-10-CM

## 2017-01-16 NOTE — Progress Notes (Signed)
Complaint:  Visit Type: Patient returns to my office for continued preventative foot care services. Complaint: Patient states" my nails have grown long and thick and become painful to walk and wear shoes"  The patient presents for preventative foot care services. No changes to ROS.  Patient is taking eliquiss.  Podiatric Exam: Vascular: dorsalis pedis and posterior tibial pulses are palpable bilateral. Capillary return is immediate. Temperature gradient is WNL. Skin turgor WNL  Sensorium: Normal Semmes Weinstein monofilament test. Normal tactile sensation bilaterally. Nail Exam: Pt has thick disfigured discolored nails with subungual debris noted bilateral entire nail hallux through fifth toenails Ulcer Exam: There is no evidence of ulcer or pre-ulcerative changes or infection. Orthopedic Exam: Muscle tone and strength are WNL. No limitations in general ROM. No crepitus or effusions noted. Foot type and digits show no abnormalities. Bony prominences are unremarkable. Skin: No Porokeratosis. No infection or ulcers  Diagnosis:  Onychomycosis, , Pain in right toe, pain in left toes  Treatment & Plan Procedures and Treatment: Consent by patient was obtained for treatment procedures. The patient understood the discussion of treatment and procedures well. All questions were answered thoroughly reviewed. Debridement of mycotic and hypertrophic toenails, 1 through 5 bilateral and clearing of subungual debris. No ulceration, no infection noted.  Return Visit-Office Procedure: Patient instructed to return to the office for a follow up visit 3 months for continued evaluation and treatment.    Jarrett Albor DPM 

## 2017-04-13 ENCOUNTER — Encounter: Payer: Self-pay | Admitting: Podiatry

## 2017-04-13 ENCOUNTER — Ambulatory Visit (INDEPENDENT_AMBULATORY_CARE_PROVIDER_SITE_OTHER): Payer: Medicare PPO | Admitting: Podiatry

## 2017-04-13 DIAGNOSIS — E119 Type 2 diabetes mellitus without complications: Secondary | ICD-10-CM

## 2017-04-13 DIAGNOSIS — D689 Coagulation defect, unspecified: Secondary | ICD-10-CM

## 2017-04-13 DIAGNOSIS — M79676 Pain in unspecified toe(s): Secondary | ICD-10-CM

## 2017-04-13 DIAGNOSIS — B351 Tinea unguium: Secondary | ICD-10-CM | POA: Diagnosis not present

## 2017-04-13 NOTE — Progress Notes (Signed)
Complaint:  Visit Type: Patient returns to my office for continued preventative foot care services. Complaint: Patient states" my nails have grown long and thick and become painful to walk and wear shoes" . The patient presents for preventative foot care services.  Patient is diabetic type 2. No changes to ROS.  Patient is taking eliquiss.  Podiatric Exam: Vascular: dorsalis pedis and posterior tibial pulses are palpable bilateral. Capillary return is immediate. Temperature gradient is WNL. Skin turgor WNL  Sensorium: Normal Semmes Weinstein monofilament test. Normal tactile sensation bilaterally. Nail Exam: Pt has thick disfigured discolored nails with subungual debris noted bilateral entire nail hallux through fifth toenails Ulcer Exam: There is no evidence of ulcer or pre-ulcerative changes or infection. Orthopedic Exam: Muscle tone and strength are WNL. No limitations in general ROM. No crepitus or effusions noted. Foot type and digits show no abnormalities. Bony prominences are unremarkable. Skin: No Porokeratosis. No infection or ulcers  Diagnosis:  Onychomycosis, , Pain in right toe, pain in left toes  Treatment & Plan Procedures and Treatment: Consent by patient was obtained for treatment procedures. The patient understood the discussion of treatment and procedures well. All questions were answered thoroughly reviewed. Debridement of mycotic and hypertrophic toenails, 1 through 5 bilateral and clearing of subungual debris. No ulceration, no infection noted.  Return Visit-Office Procedure: Patient instructed to return to the office for a follow up visit 3 months for continued evaluation and treatment.    Donne Robillard DPM 

## 2017-04-17 ENCOUNTER — Ambulatory Visit: Payer: Medicare PPO | Admitting: Podiatry

## 2017-07-12 ENCOUNTER — Ambulatory Visit
Admission: RE | Admit: 2017-07-12 | Discharge: 2017-07-12 | Disposition: A | Discharge status: Home / Self Care | Payer: Medicare PPO | Source: Ambulatory Visit | Attending: Oncology | Admitting: Oncology

## 2017-07-12 DIAGNOSIS — C50411 Malignant neoplasm of upper-outer quadrant of right female breast: Secondary | ICD-10-CM | POA: Diagnosis not present

## 2017-07-12 DIAGNOSIS — Z170 Estrogen receptor positive status [ER+]: Secondary | ICD-10-CM | POA: Diagnosis present

## 2017-07-23 NOTE — Progress Notes (Signed)
[place]  [phone] [phone]  ID: Lisa Crawford OB: 1935/03/30  MR#: [id number]  JGO#:[id number]  Patient Care Team: Maryland Pink, MD as PCP - General (Family Medicine)  CHIEF COMPLAINT: Adenocarcinoma of the right upper outer breast status post lumpectomy.  ER/PR positive, HER-2 negative, stage Ia.   INTERVAL HISTORY: Patient returns to clinic today for routine yearly follow-up.  She currently feels well and is asymptomatic.  She has no neurologic complaints.  She denies any recent fevers or illnesses.  She has a good appetite and denies weight loss.  She denies any chest pain, shortness of breath, cough, or hemoptysis.  She denies any nausea, vomiting, constipation, or diarrhea.  She denies any melena or hematochezia.  Patient feels at her baseline offers no specific complaints today.  REVIEW OF SYSTEMS:   Review of Systems  Constitutional: Negative.  Negative for fever, malaise/fatigue and weight loss.  Respiratory: Negative.  Negative for cough and shortness of breath.   Cardiovascular: Negative.  Negative for chest pain and leg swelling.  Gastrointestinal: Negative.  Negative for abdominal pain and constipation.  Genitourinary: Negative.   Musculoskeletal: Positive for joint pain.  Skin: Negative.  Negative for itching and rash.  Neurological: Negative.  Negative for sensory change, focal weakness and weakness.  Psychiatric/Behavioral: Negative.  The patient is not nervous/anxious.     As per HPI. Otherwise, a complete review of systems is negatve.  PAST MEDICAL HISTORY: Past Medical History:  Diagnosis Date  . A-fib ([place])   . Breast cancer ([place]) 2012   RT LUMPECTOMY  . Diabetes ([place])   . Personal history of radiation therapy 2012   BREAST CA    PAST SURGICAL HISTORY: Past Surgical History:  Procedure Laterality Date  . APPENDECTOMY    . BREAST BIOPSY Right 2012   POS  . BREAST BIOPSY Left 2013   NEG  . BREAST  LUMPECTOMY Right 2012   BREAST CA  . BREAST SURGERY Right   . EYE SURGERY Bilateral   . FINGER SURGERY  01/2015  . FOOT SURGERY Left   . VAGINAL HYSTERECTOMY      FAMILY HISTORY: Reviewed and unchanged. No reported history of malignancy or chronic disease.     ADVANCED DIRECTIVES:    HEALTH MAINTENANCE: Social History   Tobacco Use  . Smoking status: Never Smoker  . Smokeless tobacco: Never Used  Substance Use Topics  . Alcohol use: No  . Drug use: No     Colonoscopy:  PAP:  Bone density:  Lipid panel:  Allergies  Allergen Reactions  . Antihistamines, Chlorpheniramine-Type Other (See Comments)    Stay awake  . Antihistamines, Diphenhydramine-Type Other (See Comments)    KEEPS AWAKE AND VERY WIRED  . Clemastine Other (See Comments)    KEEPS AWAKE AND VERY WIRED KEEPS AWAKE AND VERY WIRED    Current Outpatient Medications  Medication Sig Dispense Refill  . amLODipine-benazepril (LOTREL) 5-40 MG per capsule Take 1 capsule by mouth 2 (two) times daily.    Marland Kitchen atenolol (TENORMIN) 25 MG tablet Take by mouth daily.    . divalproex (DEPAKOTE ER) 250 MG 24 hr tablet     . ELIQUIS 5 MG TABS tablet     . flecainide (TAMBOCOR) 50 MG tablet Take 50 mg by mouth 2 (two) times daily.    Marland Kitchen glimepiride (AMARYL) 2 MG tablet TAKE 1 TABLET BY MOUTH 2 (TWO) TIMES DAILY.    Marland Kitchen latanoprost (XALATAN) 0.005 % ophthalmic solution Place 1  drop into both eyes at bedtime.    . Levothyroxine Sodium 50 MCG CAPS Take by mouth daily before breakfast.    . meloxicam (MOBIC) 15 MG tablet     . metFORMIN (GLUCOPHAGE) 500 MG tablet Take 1,000 mg by mouth 2 (two) times daily with a meal.    . Multiple Vitamin (MULTIVITAMIN) tablet Take 1 tablet by mouth daily.    . naproxen (NAPROSYN) 500 MG tablet Take 1 tablet (500 mg total) by mouth 2 (two) times daily with a meal. 20 tablet 0  . timolol (TIMOPTIC) 0.5 % ophthalmic solution Place 1 drop into both eyes.     . Timolol Maleate PF 0.5 % SOLN Apply 1  drop to eye every evening.    . TRADJENTA 5 MG TABS tablet Take 5 mg by mouth daily.     Marland Kitchen acetaminophen (TYLENOL) 500 MG tablet Take 500 mg by mouth 2 (two) times daily.     No current facility-administered medications for this visit.     OBJECTIVE: Vitals:   07/27/17 1400  BP: 140/80  Pulse: 82  Resp: (!) 22  Temp: 98 F (36.7 C)     Body mass index is 35.43 kg/m.    ECOG FS:0 - Asymptomatic  General: Well-developed, well-nourished, no acute distress. Eyes: Pink conjunctiva, anicteric sclera. Breast: Bilateral breast and axilla without lumps or masses. Lungs: Clear to auscultation bilaterally. Heart: Regular rate and rhythm. No rubs, murmurs, or gallops. Abdomen: Soft, nontender, nondistended. No organomegaly noted, normoactive bowel sounds. Musculoskeletal: No edema, cyanosis, or clubbing. Neuro: Alert, answering all questions appropriately. Cranial nerves grossly intact. Skin: No rashes or petechiae noted. Psych: Normal affect.  LAB RESULTS:  Lab Results  Component Value Date   NA 141 01/13/2014   K 4.0 01/13/2014   CL 101 01/13/2014   CO2 29 01/13/2014   GLUCOSE 158 (H) 01/13/2014   BUN 18 01/13/2014   CREATININE 0.84 01/13/2014   CALCIUM 9.4 01/13/2014   PROT 7.1 01/13/2014   ALBUMIN 3.6 01/13/2014   AST 13 (L) 01/13/2014   ALT 21 01/13/2014   ALKPHOS 68 01/13/2014   BILITOT 0.4 01/13/2014   GFRNONAA >60 01/13/2014   GFRAA >60 01/13/2014    Lab Results  Component Value Date   WBC 7.2 01/13/2014   NEUTROABS 4.8 01/13/2014   HGB 14.3 01/13/2014   HCT 43.6 01/13/2014   MCV 96 01/13/2014   PLT 260 01/13/2014     STUDIES: Mm Diag Breast Tomo Bilateral  Result Date: 07/12/2017 CLINICAL DATA:  History of treated right breast cancer, status post lumpectomy and radiation therapy in 2012. EXAM: DIGITAL DIAGNOSTIC BILATERAL MAMMOGRAM WITH CAD AND TOMO COMPARISON:  Previous exam(s). ACR Breast Density Category b: There are scattered areas of fibroglandular  density. FINDINGS: Mammographically, there are no suspicious masses, areas of nonsurgical architectural distortion or microcalcifications in either breast. Stable posttreatment changes in the right breast. Mammographic images were processed with CAD. IMPRESSION: No mammographic evidence of malignancy in either breast, status post right lumpectomy. RECOMMENDATION: Screening mammogram in one year.(Code:SM-B-01Y) I have discussed the findings and recommendations with the patient. Results were also provided in writing at the conclusion of the visit. If applicable, a reminder letter will be sent to the patient regarding the next appointment. BI-RADS CATEGORY  2: Benign. Electronically Signed   By: Fidela Salisbury M.D.   On: 07/12/2017 11:32    ASSESSMENT: Adenocarcinoma of the right upper outer breast status post lumpectomy.  ER/PR positive, HER-2 negative, stage Ia.  PLAN:    1.  Adenocarcinoma of the right upper outer breast status post lumpectomy.  ER/PR positive, HER-2 negative, stage Ia: No evidence of disease.  Patient completed 5 years of letrozole in approximately July 2018. Patient's most recent mammogram on July 12, 2017 was reported as BI-RADS 2, repeat in June 2020.  Return to clinic in 1 year for routine evaluation.   2. Osteopenia:  Her most recent bone mineral density on July 08, 2015 reported a T score of -1.1, which is unchanged from previous. Continue calcium and vitamin D. Now the patient has discontinued letrozole, her bone mineral densities can be monitored by her primary care physician. 3. Atrial fibrillation: Continue Eliquis prescribed.  Follow-up with cardiology as indicated. 4.  Hyperglycemia: Continue diabetic medications as prescribed.  Follow-up with primary care as scheduled.  I spent a total of 20 minutes face-to-face with the patient of which greater than 50% of the visit was spent in counseling and coordination of care as detailed above.   Patient expressed  understanding and was in agreement with this plan. She also understands that She can call clinic at any time with any questions, concerns, or complaints.   Lloyd Huger, MD   08/01/2017 11:42 AM

## 2017-07-24 ENCOUNTER — Encounter: Payer: Self-pay | Admitting: Podiatry

## 2017-07-24 ENCOUNTER — Ambulatory Visit (INDEPENDENT_AMBULATORY_CARE_PROVIDER_SITE_OTHER): Payer: Medicare PPO | Admitting: Podiatry

## 2017-07-24 DIAGNOSIS — B351 Tinea unguium: Secondary | ICD-10-CM | POA: Diagnosis not present

## 2017-07-24 DIAGNOSIS — D689 Coagulation defect, unspecified: Secondary | ICD-10-CM

## 2017-07-24 DIAGNOSIS — E119 Type 2 diabetes mellitus without complications: Secondary | ICD-10-CM

## 2017-07-24 DIAGNOSIS — M79676 Pain in unspecified toe(s): Secondary | ICD-10-CM

## 2017-07-24 NOTE — Progress Notes (Signed)
Complaint:  Visit Type: Patient returns to my office for continued preventative foot care services. Complaint: Patient states" my nails have grown long and thick and become painful to walk and wear shoes" . The patient presents for preventative foot care services.  Patient is diabetic type 2. No changes to ROS.  Patient is taking eliquiss.  Podiatric Exam: Vascular: dorsalis pedis and posterior tibial pulses are palpable bilateral. Capillary return is immediate. Temperature gradient is WNL. Skin turgor WNL  Sensorium: Normal Semmes Weinstein monofilament test. Normal tactile sensation bilaterally. Nail Exam: Pt has thick disfigured discolored nails with subungual debris noted bilateral entire nail hallux through fifth toenails Ulcer Exam: There is no evidence of ulcer or pre-ulcerative changes or infection. Orthopedic Exam: Muscle tone and strength are WNL. No limitations in general ROM. No crepitus or effusions noted. Foot type and digits show no abnormalities. Bony prominences are unremarkable. Skin: No Porokeratosis. No infection or ulcers  Diagnosis:  Onychomycosis, , Pain in right toe, pain in left toes  Treatment & Plan Procedures and Treatment: Consent by patient was obtained for treatment procedures. The patient understood the discussion of treatment and procedures well. All questions were answered thoroughly reviewed. Debridement of mycotic and hypertrophic toenails, 1 through 5 bilateral and clearing of subungual debris. No ulceration, no infection noted.  Return Visit-Office Procedure: Patient instructed to return to the office for a follow up visit 3 months for continued evaluation and treatment.    Gardiner Barefoot DPM

## 2017-07-27 ENCOUNTER — Other Ambulatory Visit: Payer: Self-pay

## 2017-07-27 ENCOUNTER — Encounter: Payer: Self-pay | Admitting: Oncology

## 2017-07-27 ENCOUNTER — Inpatient Hospital Stay: Payer: Medicare PPO | Attending: Oncology | Admitting: Oncology

## 2017-07-27 VITALS — BP 140/80 | HR 82 | Temp 98.0°F | Resp 22 | Ht 61.0 in | Wt 187.5 lb

## 2017-07-27 DIAGNOSIS — M8580 Other specified disorders of bone density and structure, unspecified site: Secondary | ICD-10-CM | POA: Diagnosis not present

## 2017-07-27 DIAGNOSIS — Z7901 Long term (current) use of anticoagulants: Secondary | ICD-10-CM | POA: Diagnosis not present

## 2017-07-27 DIAGNOSIS — E1165 Type 2 diabetes mellitus with hyperglycemia: Secondary | ICD-10-CM | POA: Insufficient documentation

## 2017-07-27 DIAGNOSIS — Z923 Personal history of irradiation: Secondary | ICD-10-CM | POA: Diagnosis not present

## 2017-07-27 DIAGNOSIS — Z170 Estrogen receptor positive status [ER+]: Secondary | ICD-10-CM | POA: Diagnosis not present

## 2017-07-27 DIAGNOSIS — Z853 Personal history of malignant neoplasm of breast: Secondary | ICD-10-CM

## 2017-07-27 DIAGNOSIS — M8589 Other specified disorders of bone density and structure, multiple sites: Secondary | ICD-10-CM

## 2017-07-27 DIAGNOSIS — C50411 Malignant neoplasm of upper-outer quadrant of right female breast: Secondary | ICD-10-CM | POA: Insufficient documentation

## 2017-07-27 DIAGNOSIS — I4891 Unspecified atrial fibrillation: Secondary | ICD-10-CM | POA: Diagnosis not present

## 2017-11-20 ENCOUNTER — Ambulatory Visit (INDEPENDENT_AMBULATORY_CARE_PROVIDER_SITE_OTHER): Payer: Medicare PPO | Admitting: Podiatry

## 2017-11-20 ENCOUNTER — Encounter: Payer: Self-pay | Admitting: Podiatry

## 2017-11-20 DIAGNOSIS — E119 Type 2 diabetes mellitus without complications: Secondary | ICD-10-CM | POA: Diagnosis not present

## 2017-11-20 DIAGNOSIS — M79676 Pain in unspecified toe(s): Secondary | ICD-10-CM | POA: Diagnosis not present

## 2017-11-20 DIAGNOSIS — B351 Tinea unguium: Secondary | ICD-10-CM | POA: Diagnosis not present

## 2017-11-20 DIAGNOSIS — D689 Coagulation defect, unspecified: Secondary | ICD-10-CM

## 2017-11-20 NOTE — Progress Notes (Signed)
Complaint:  Visit Type: Patient returns to my office for continued preventative foot care services. Complaint: Patient states" my nails have grown long and thick and become painful to walk and wear shoes" . The patient presents for preventative foot care services.  Patient is diabetic type 2. No changes to ROS.  Patient is taking eliquiss.  Podiatric Exam: Vascular: dorsalis pedis and posterior tibial pulses are palpable bilateral. Capillary return is immediate. Temperature gradient is WNL. Skin turgor WNL  Sensorium: Normal Semmes Weinstein monofilament test. Normal tactile sensation bilaterally. Nail Exam: Pt has thick disfigured discolored nails with subungual debris noted bilateral entire nail hallux through fifth toenails Ulcer Exam: There is no evidence of ulcer or pre-ulcerative changes or infection. Orthopedic Exam: Muscle tone and strength are WNL. No limitations in general ROM. No crepitus or effusions noted. Foot type and digits show no abnormalities. Bony prominences are unremarkable. Skin: No Porokeratosis. No infection or ulcers  Diagnosis:  Onychomycosis, , Pain in right toe, pain in left toes  Treatment & Plan Procedures and Treatment: Consent by patient was obtained for treatment procedures. The patient understood the discussion of treatment and procedures well. All questions were answered thoroughly reviewed. Debridement of mycotic and hypertrophic toenails, 1 through 5 bilateral and clearing of subungual debris. No ulceration, no infection noted.  Return Visit-Office Procedure: Patient instructed to return to the office for a follow up visit 4 months for continued evaluation and treatment.    Ronny Ruddell DPM 

## 2018-02-09 ENCOUNTER — Ambulatory Visit (INDEPENDENT_AMBULATORY_CARE_PROVIDER_SITE_OTHER): Payer: Self-pay | Admitting: Internal Medicine

## 2018-02-09 DIAGNOSIS — Z7185 Encounter for immunization safety counseling: Secondary | ICD-10-CM

## 2018-02-09 DIAGNOSIS — Z789 Other specified health status: Secondary | ICD-10-CM

## 2018-02-09 DIAGNOSIS — Z23 Encounter for immunization: Secondary | ICD-10-CM

## 2018-02-09 DIAGNOSIS — Z7189 Other specified counseling: Secondary | ICD-10-CM

## 2018-02-09 DIAGNOSIS — Z7184 Encounter for health counseling related to travel: Secondary | ICD-10-CM

## 2018-02-09 DIAGNOSIS — Z9189 Other specified personal risk factors, not elsewhere classified: Secondary | ICD-10-CM

## 2018-02-09 MED ORDER — ATOVAQUONE-PROGUANIL HCL 250-100 MG PO TABS
1.0000 | ORAL_TABLET | Freq: Every day | ORAL | 0 refills | Status: DC
Start: 2018-02-09 — End: 2022-08-04

## 2018-02-09 MED ORDER — AZITHROMYCIN 500 MG PO TABS
500.0000 mg | ORAL_TABLET | Freq: Every day | ORAL | 0 refills | Status: DC
Start: 2018-02-09 — End: 2022-08-03

## 2018-02-09 NOTE — Patient Instructions (Signed)
Please remember to get your TDAP vaccine between now and June. Have a great trip

## 2018-02-09 NOTE — Progress Notes (Signed)
Subjective:   Lisa Crawford is a 83 y.o. female who presents to the Infectious Disease clinic for travel consultation. Planned departure date: June 22     Planned return date: July 2nd Countries of travel: [place] Areas in country: rural Accommodations: hotel/guesthouse Purpose of travel: mission trip with 10 other folks Prior travel out of [place]: yes- [place], and [place] to [employer] to [place]-> to [place] _. [place] -  then drive to [place], also doing [place] for 3 days at [place]  Previous vaccine - she has had hep A and hep B, this year's flu and pneumovax, and shingrix   Objective:   meds reviewed Assessment:   No contraindications to travel. none   Plan:   Pre-travel vaccine = will give injectable typhoid vaccine, in addition will recommend her to get tdap. uptodate on hep a and hep b per her verbal hx.  Malaria proph = will give malarone, and bug bite prevention  Traveler's diarrhea = will give rx for azithro to use if needed

## 2018-03-19 ENCOUNTER — Ambulatory Visit (INDEPENDENT_AMBULATORY_CARE_PROVIDER_SITE_OTHER): Payer: Medicare PPO | Admitting: Podiatry

## 2018-03-19 ENCOUNTER — Encounter: Payer: Self-pay | Admitting: Podiatry

## 2018-03-19 DIAGNOSIS — E119 Type 2 diabetes mellitus without complications: Secondary | ICD-10-CM | POA: Diagnosis not present

## 2018-03-19 DIAGNOSIS — D689 Coagulation defect, unspecified: Secondary | ICD-10-CM | POA: Diagnosis not present

## 2018-03-19 DIAGNOSIS — M79676 Pain in unspecified toe(s): Secondary | ICD-10-CM | POA: Diagnosis not present

## 2018-03-19 DIAGNOSIS — B351 Tinea unguium: Secondary | ICD-10-CM

## 2018-03-19 NOTE — Progress Notes (Signed)
Complaint:  Visit Type: Patient returns to my office for continued preventative foot care services. Complaint: Patient states" my nails have grown long and thick and become painful to walk and wear shoes" . The patient presents for preventative foot care services.  Patient is diabetic type 2. No changes to ROS.  Patient is taking eliquiss.  Podiatric Exam: Vascular: dorsalis pedis and posterior tibial pulses are palpable bilateral. Capillary return is immediate. Temperature gradient is WNL. Skin turgor WNL  Sensorium: Normal Semmes Weinstein monofilament test. Normal tactile sensation bilaterally. Nail Exam: Pt has thick disfigured discolored nails with subungual debris noted bilateral entire nail hallux through fifth toenails Ulcer Exam: There is no evidence of ulcer or pre-ulcerative changes or infection. Orthopedic Exam: Muscle tone and strength are WNL. No limitations in general ROM. No crepitus or effusions noted. Foot type and digits show no abnormalities. Bony prominences are unremarkable. Skin: No Porokeratosis. No infection or ulcers  Diagnosis:  Onychomycosis, , Pain in right toe, pain in left toes  Treatment & Plan Procedures and Treatment: Consent by patient was obtained for treatment procedures. The patient understood the discussion of treatment and procedures well. All questions were answered thoroughly reviewed. Debridement of mycotic and hypertrophic toenails, 1 through 5 bilateral and clearing of subungual debris. No ulceration, no infection noted.  Return Visit-Office Procedure: Patient instructed to return to the office for a follow up visit 4 months for continued evaluation and treatment.    Sallye Lunz DPM 

## 2018-07-24 ENCOUNTER — Ambulatory Visit
Admission: RE | Admit: 2018-07-24 | Discharge: 2018-07-24 | Disposition: A | Discharge status: Home / Self Care | Payer: Medicare PPO | Source: Ambulatory Visit | Attending: Oncology | Admitting: Oncology

## 2018-07-24 ENCOUNTER — Other Ambulatory Visit: Payer: Self-pay

## 2018-07-24 DIAGNOSIS — Z170 Estrogen receptor positive status [ER+]: Secondary | ICD-10-CM | POA: Insufficient documentation

## 2018-07-24 DIAGNOSIS — C50411 Malignant neoplasm of upper-outer quadrant of right female breast: Secondary | ICD-10-CM

## 2018-07-24 DIAGNOSIS — Z1231 Encounter for screening mammogram for malignant neoplasm of breast: Secondary | ICD-10-CM | POA: Insufficient documentation

## 2018-07-26 ENCOUNTER — Ambulatory Visit: Payer: Medicare PPO | Admitting: Oncology

## 2018-07-27 ENCOUNTER — Other Ambulatory Visit: Payer: Self-pay

## 2018-07-29 NOTE — Progress Notes (Signed)
[facility]  [phone] [phone]  ID: Lisa Crawford OB: 1935/03/20  MR#: [id number]  WIO#:[id number]  Patient Care Team: Maryland Pink, MD as PCP - General (Family Medicine)  CHIEF COMPLAINT: Adenocarcinoma of the right upper outer breast status post lumpectomy.  ER/PR positive, HER-2 negative, stage Ia.   INTERVAL HISTORY: Patient returns to clinic today for routine yearly evaluation.  She continues to feel well and remains asymptomatic. She has no neurologic complaints.  She denies any recent fevers or illnesses.  She has a good appetite and denies weight loss.  She denies any chest pain, shortness of breath, cough, or hemoptysis.  She denies any nausea, vomiting, constipation, or diarrhea.  She denies any melena or hematochezia.  Patient offers no specific complaints today.  REVIEW OF SYSTEMS:   Review of Systems  Constitutional: Negative.  Negative for fever, malaise/fatigue and weight loss.  Respiratory: Negative.  Negative for cough, hemoptysis and shortness of breath.   Cardiovascular: Negative.  Negative for chest pain and leg swelling.  Gastrointestinal: Negative.  Negative for abdominal pain and constipation.  Genitourinary: Negative.  Negative for dysuria.  Musculoskeletal: Negative.  Negative for joint pain.  Skin: Negative.  Negative for rash.  Neurological: Negative.  Negative for dizziness, sensory change, focal weakness, weakness and headaches.  Psychiatric/Behavioral: Negative.  The patient is not nervous/anxious.     As per HPI. Otherwise, a complete review of systems is negatve.  PAST MEDICAL HISTORY: Past Medical History:  Diagnosis Date  . A-fib ([place])   . Breast cancer ([place]) 2012   RT LUMPECTOMY  . Diabetes ([place])   . Personal history of radiation therapy 2012   BREAST CA    PAST SURGICAL HISTORY: Past Surgical History:  Procedure Laterality Date  . APPENDECTOMY    . BREAST BIOPSY Right 2012   Invasive  Carcinoma  . BREAST BIOPSY Left 2013   NEG  . BREAST LUMPECTOMY Right 2012   BREAST CA  . BREAST SURGERY Right   . EYE SURGERY Bilateral   . FINGER SURGERY  01/2015  . FOOT SURGERY Left   . VAGINAL HYSTERECTOMY      FAMILY HISTORY: Reviewed and unchanged. No reported history of malignancy or chronic disease.     ADVANCED DIRECTIVES:    HEALTH MAINTENANCE: Social History   Tobacco Use  . Smoking status: Never Smoker  . Smokeless tobacco: Never Used  Substance Use Topics  . Alcohol use: No  . Drug use: No     Colonoscopy:  PAP:  Bone density:  Lipid panel:  Allergies  Allergen Reactions  . Antihistamines, Chlorpheniramine-Type Other (See Comments)    Stay awake  . Antihistamines, Diphenhydramine-Type Other (See Comments)    KEEPS AWAKE AND VERY WIRED  . Clemastine Other (See Comments)    KEEPS AWAKE AND VERY WIRED KEEPS AWAKE AND VERY WIRED    Current Outpatient Medications  Medication Sig Dispense Refill  . acetaminophen (TYLENOL) 500 MG tablet Take 500 mg by mouth 2 (two) times daily.    Marland Kitchen amLODipine-benazepril (LOTREL) 5-40 MG per capsule Take 1 capsule by mouth 2 (two) times daily.    Marland Kitchen atenolol (TENORMIN) 25 MG tablet Take by mouth daily.    Marland Kitchen atovaquone-proguanil (MALARONE) 250-100 MG TABS tablet Take 1 tablet by mouth daily. Start taking on June 20th, take on full stomach daily. Until complete 20 tablet 0  . azithromycin (ZITHROMAX) 500 MG tablet Take 1 tablet (500 mg total) by mouth daily. If you have  4 watery stools/24hr. Can stop taking if diarrhea stops 5 tablet 0  . Blood Glucose Monitoring Suppl (ONE TOUCH ULTRA 2) w/Device KIT USE AS DIRECTED TWICE A DAY    . divalproex (DEPAKOTE ER) 250 MG 24 hr tablet     . ELIQUIS 5 MG TABS tablet     . flecainide (TAMBOCOR) 50 MG tablet Take 50 mg by mouth 2 (two) times daily.    Marland Kitchen glimepiride (AMARYL) 2 MG tablet TAKE 1 TABLET BY MOUTH 2 (TWO) TIMES DAILY.    Marland Kitchen JANUVIA 100 MG tablet     . latanoprost  (XALATAN) 0.005 % ophthalmic solution Place 1 drop into both eyes at bedtime.    . Levothyroxine Sodium 50 MCG CAPS Take by mouth daily before breakfast.    . meloxicam (MOBIC) 15 MG tablet     . metFORMIN (GLUCOPHAGE) 500 MG tablet Take 1,000 mg by mouth 2 (two) times daily with a meal.    . metroNIDAZOLE (METROCREAM) 0.75 % cream Apply topically.    . Multiple Vitamin (MULTIVITAMIN) tablet Take 1 tablet by mouth daily.    . naproxen (NAPROSYN) 500 MG tablet Take 1 tablet (500 mg total) by mouth 2 (two) times daily with a meal. 20 tablet 0  . timolol (TIMOPTIC) 0.5 % ophthalmic solution Place 1 drop into both eyes.     . Timolol Maleate PF 0.5 % SOLN Apply 1 drop to eye every evening.    . TRADJENTA 5 MG TABS tablet Take 5 mg by mouth daily.      No current facility-administered medications for this visit.     OBJECTIVE: Vitals:   07/30/18 1411  BP: 137/82  Pulse: 60  Resp: 18     Body mass index is 33.44 kg/m.    ECOG FS:0 - Asymptomatic  General: Well-developed, well-nourished, no acute distress. Eyes: Pink conjunctiva, anicteric sclera. HEENT: Normocephalic, moist mucous membranes. Breast: Patient declined breast exam today. Lungs: Clear to auscultation bilaterally. Heart: Regular rate and rhythm. No rubs, murmurs, or gallops. Abdomen: Soft, nontender, nondistended. No organomegaly noted, normoactive bowel sounds. Musculoskeletal: No edema, cyanosis, or clubbing. Neuro: Alert, answering all questions appropriately. Cranial nerves grossly intact. Skin: No rashes or petechiae noted. Psych: Normal affect.  LAB RESULTS:  Lab Results  Component Value Date   NA 141 01/13/2014   K 4.0 01/13/2014   CL 101 01/13/2014   CO2 29 01/13/2014   GLUCOSE 158 (H) 01/13/2014   BUN 18 01/13/2014   CREATININE 0.84 01/13/2014   CALCIUM 9.4 01/13/2014   PROT 7.1 01/13/2014   ALBUMIN 3.6 01/13/2014   AST 13 (L) 01/13/2014   ALT 21 01/13/2014   ALKPHOS 68 01/13/2014   BILITOT 0.4  01/13/2014   GFRNONAA >60 01/13/2014   GFRAA >60 01/13/2014    Lab Results  Component Value Date   WBC 7.2 01/13/2014   NEUTROABS 4.8 01/13/2014   HGB 14.3 01/13/2014   HCT 43.6 01/13/2014   MCV 96 01/13/2014   PLT 260 01/13/2014     STUDIES: Mm 3d Screen Breast Bilateral  Result Date: 07/24/2018 CLINICAL DATA:  Screening. EXAM: DIGITAL SCREENING BILATERAL MAMMOGRAM WITH TOMO AND CAD COMPARISON:  Previous exam(s). ACR Breast Density Category b: There are scattered areas of fibroglandular density. FINDINGS: There are no findings suspicious for malignancy. Images were processed with CAD. IMPRESSION: No mammographic evidence of malignancy. A result letter of this screening mammogram will be mailed directly to the patient. RECOMMENDATION: Screening mammogram in one year. (Code:SM-B-01Y) BI-RADS CATEGORY  1: Negative. Electronically Signed   By: Claudie Revering M.D.   On: 07/24/2018 14:14    ASSESSMENT: Adenocarcinoma of the right upper outer breast status post lumpectomy.  ER/PR positive, HER-2 negative, stage Ia.  PLAN:    1.  Adenocarcinoma of the right upper outer breast status post lumpectomy.  ER/PR positive, HER-2 negative, stage Ia: No evidence of disease.  Patient completed 5 years of letrozole in approximately July 2018.  Patient's most recent mammogram on July 24, 2018 was reported as BI-RADS 2.  After lengthy discussion with the patient, is agreed upon that no further follow-up is necessary.  She plans to continue follow-up with her primary care physician who now can order and monitor her mammograms.  Please refer patient back if there are any questions or concerns.   2. Osteopenia:  Her most recent bone mineral density on July 08, 2015 reported a T score of -1.1, which is unchanged from previous. Continue calcium and vitamin D. Now the patient has discontinued letrozole, her bone mineral densities can be monitored by her primary care physician. 3. Atrial fibrillation: Continue Eliquis  prescribed.  Follow-up with cardiology as indicated. 4.  Hyperglycemia: Continue diabetic medications as prescribed.  Follow-up with primary care as scheduled.  I spent a total of 20 minutes face-to-face with the patient of which greater than 50% of the visit was spent in counseling and coordination of care as detailed above.  Patient expressed understanding and was in agreement with this plan. She also understands that She can call clinic at any time with any questions, concerns, or complaints.   Lloyd Huger, MD   07/31/2018 1:22 PM

## 2018-07-30 ENCOUNTER — Other Ambulatory Visit: Payer: Self-pay

## 2018-07-30 ENCOUNTER — Ambulatory Visit (INDEPENDENT_AMBULATORY_CARE_PROVIDER_SITE_OTHER): Payer: Medicare PPO | Admitting: Podiatry

## 2018-07-30 ENCOUNTER — Inpatient Hospital Stay: Payer: Medicare PPO | Attending: Oncology | Admitting: Oncology

## 2018-07-30 ENCOUNTER — Encounter: Payer: Self-pay | Admitting: Podiatry

## 2018-07-30 VITALS — Temp 98.0°F

## 2018-07-30 VITALS — BP 137/82 | HR 60 | Resp 18 | Wt 177.0 lb

## 2018-07-30 DIAGNOSIS — C50411 Malignant neoplasm of upper-outer quadrant of right female breast: Secondary | ICD-10-CM | POA: Insufficient documentation

## 2018-07-30 DIAGNOSIS — M79676 Pain in unspecified toe(s): Secondary | ICD-10-CM

## 2018-07-30 DIAGNOSIS — B351 Tinea unguium: Secondary | ICD-10-CM | POA: Diagnosis not present

## 2018-07-30 DIAGNOSIS — Z7901 Long term (current) use of anticoagulants: Secondary | ICD-10-CM | POA: Diagnosis not present

## 2018-07-30 DIAGNOSIS — E119 Type 2 diabetes mellitus without complications: Secondary | ICD-10-CM

## 2018-07-30 DIAGNOSIS — Z170 Estrogen receptor positive status [ER+]: Secondary | ICD-10-CM | POA: Diagnosis not present

## 2018-07-30 DIAGNOSIS — M8580 Other specified disorders of bone density and structure, unspecified site: Secondary | ICD-10-CM | POA: Insufficient documentation

## 2018-07-30 DIAGNOSIS — I4891 Unspecified atrial fibrillation: Secondary | ICD-10-CM | POA: Diagnosis not present

## 2018-07-30 DIAGNOSIS — R739 Hyperglycemia, unspecified: Secondary | ICD-10-CM | POA: Insufficient documentation

## 2018-07-30 DIAGNOSIS — D689 Coagulation defect, unspecified: Secondary | ICD-10-CM

## 2018-07-30 NOTE — Progress Notes (Signed)
Patient denies any concerns today.  

## 2018-07-30 NOTE — Progress Notes (Signed)
Complaint:  Visit Type: Patient returns to my office for continued preventative foot care services. Complaint: Patient states" my nails have grown long and thick and become painful to walk and wear shoes" . The patient presents for preventative foot care services.  Patient is diabetic type 2. No changes to ROS.  Patient is taking eliquiss.  Podiatric Exam: Vascular: dorsalis pedis and posterior tibial pulses are palpable bilateral. Capillary return is immediate. Temperature gradient is WNL. Skin turgor WNL  Sensorium: Normal Semmes Weinstein monofilament test. Normal tactile sensation bilaterally. Nail Exam: Pt has thick disfigured discolored nails with subungual debris noted bilateral entire nail hallux through fifth toenails Ulcer Exam: There is no evidence of ulcer or pre-ulcerative changes or infection. Orthopedic Exam: Muscle tone and strength are WNL. No limitations in general ROM. No crepitus or effusions noted. Foot type and digits show no abnormalities. Bony prominences are unremarkable. Skin: No Porokeratosis. No infection or ulcers  Diagnosis:  Onychomycosis, , Pain in right toe, pain in left toes  Treatment & Plan Procedures and Treatment: Consent by patient was obtained for treatment procedures. The patient understood the discussion of treatment and procedures well. All questions were answered thoroughly reviewed. Debridement of mycotic and hypertrophic toenails, 1 through 5 bilateral and clearing of subungual debris. No ulceration, no infection noted.  Return Visit-Office Procedure: Patient instructed to return to the office for a follow up visit 4 months for continued evaluation and treatment.    Gardiner Barefoot DPM

## 2018-09-04 ENCOUNTER — Other Ambulatory Visit: Payer: Self-pay

## 2018-09-04 ENCOUNTER — Encounter: Payer: Medicare PPO | Attending: Family Medicine | Admitting: Dietician

## 2018-09-04 ENCOUNTER — Encounter: Payer: Self-pay | Admitting: Dietician

## 2018-09-04 VITALS — Ht 60.0 in | Wt 179.8 lb

## 2018-09-04 DIAGNOSIS — Z7984 Long term (current) use of oral hypoglycemic drugs: Secondary | ICD-10-CM | POA: Insufficient documentation

## 2018-09-04 DIAGNOSIS — E1159 Type 2 diabetes mellitus with other circulatory complications: Secondary | ICD-10-CM | POA: Diagnosis not present

## 2018-09-04 DIAGNOSIS — I4891 Unspecified atrial fibrillation: Secondary | ICD-10-CM | POA: Diagnosis not present

## 2018-09-04 DIAGNOSIS — E039 Hypothyroidism, unspecified: Secondary | ICD-10-CM | POA: Insufficient documentation

## 2018-09-04 DIAGNOSIS — Z923 Personal history of irradiation: Secondary | ICD-10-CM | POA: Diagnosis not present

## 2018-09-04 DIAGNOSIS — Z79899 Other long term (current) drug therapy: Secondary | ICD-10-CM | POA: Diagnosis not present

## 2018-09-04 DIAGNOSIS — Z713 Dietary counseling and surveillance: Secondary | ICD-10-CM | POA: Insufficient documentation

## 2018-09-04 DIAGNOSIS — Z79890 Hormone replacement therapy (postmenopausal): Secondary | ICD-10-CM | POA: Insufficient documentation

## 2018-09-04 DIAGNOSIS — Z7901 Long term (current) use of anticoagulants: Secondary | ICD-10-CM | POA: Diagnosis not present

## 2018-09-04 DIAGNOSIS — Z853 Personal history of malignant neoplasm of breast: Secondary | ICD-10-CM | POA: Insufficient documentation

## 2018-09-04 DIAGNOSIS — E1169 Type 2 diabetes mellitus with other specified complication: Secondary | ICD-10-CM

## 2018-09-04 DIAGNOSIS — I10 Essential (primary) hypertension: Secondary | ICD-10-CM | POA: Diagnosis not present

## 2018-09-04 NOTE — Patient Instructions (Addendum)
Balance meals with 1-3 oz protein, 2-3 servings of carbohydrate and non-starchy vegetables. Refer to "Planning a Balanced Meal" handout.  Spread 8 servings carbohydrate over 3 meals and 1-2 snacks. Split some of the entree meals over 2-3 meals. Add steamed vegetables and raw vegetables (non-starchy). Refer to snack handout for suggestions. Try to keep snack to one serving of carbohydrate. Ex. 3-4 cups popcorn

## 2018-09-04 NOTE — Progress Notes (Signed)
Medical Nutrition Therapy:  Visit start time:1050  end time:1150    Assessment:  Diagnosis: Type 2 diabetes Past medical history: hypertension, hypothyroidism, hx of breast cancer Psychosocial issues/ stress concerns:  None identified; rates her stress as low  Preferred learning method:  . Auditory . Visual  Current weight: 179.8 lbs Height: 60 in BMI: 35.11 Medications, supplements: see list  Progress and evaluation:  Patient in for initial medical nutrition therapy appointment.  She reports she checks her blood glucose 2 x/day, fasting and alternates post meal readings. She reports that most of her glucose readings are in the low 200's. Her most recent HgA1c was 7.1. She states she would like help with balancing foods. She states she would also like to lose weight. She eats 3 meals/day with 8-9 "take out" meals per week. She is willing to do simple meal preparation.  Physical activity:walks outside 3 days per week for 30 minutes if weather allows. Indoor walking track which she was previously using at "[facility]" is not open at this time.  Dietary Intake:  Usual eating pattern includes 3 meals and 1-2 snacks per day. Breakfast: 8:30am- [place] yogurt Lunch: 12:30- ham sandwich, fruit, diet coke or entree salad with bread, water Snack: popcorn or Goldfish Supper: 5-6:00pm- "take out" pasta meal and soup or entree salad with grilled chicken Snack: popcorn or Goldfish Beverages: water and diet coke  Nutrition Care Education:  Diabetes:   Instructed on a meal plan based on 1300 calories including carbohydrate counting and how to better balance carbohydrate, protein and non-starchy vegetables and portion control. Discussed simple meals that could be prepared at home. Discussed difficulty of controlling portions, sodium, and fat with frequent meals "out". Discussed strategies such as splitting pasta meal into 2-3 meals and adding raw or steamed vegetables.  Nutritional Diagnosis:   [place]-3.3 Overweight/obesity As related to frequent "take out" meals and decreased physical activity.  As evidenced by diet and exercise history..  Intervention:  Balance meals with 1-3 oz protein, 2-3 servings of carbohydrate and non-starchy vegetables. Refer to "Planning a Balanced Meal" handout.  Spread 8 servings carbohydrate over 3 meals and 1-2 snacks. Split some of the entree meals over 2-3 meals. Add steamed vegetables and raw vegetables (non-starchy). Refer to snack handout for suggestions.  Education Materials given:  . Plate Planner . Food lists/ Planning A Balanced Meal . Sample meal pattern/ menus . Snacking handout . Goals/ instructions  Learner/ who was taught:  . Patient   Level of understanding:  Verbalized understanding. Demonstrated degree of understanding via:   Teach back Learning barriers: . None Willingness to learn/ readiness for change: . Acceptance, ready for change  Monitoring and Evaluation:   No follow-up was scheduled. Patient was encouraged to call if desires further help or has further questions.

## 2018-12-03 ENCOUNTER — Ambulatory Visit (INDEPENDENT_AMBULATORY_CARE_PROVIDER_SITE_OTHER): Payer: Medicare PPO | Admitting: Podiatry

## 2018-12-03 ENCOUNTER — Other Ambulatory Visit: Payer: Self-pay

## 2018-12-03 ENCOUNTER — Encounter: Payer: Self-pay | Admitting: Podiatry

## 2018-12-03 DIAGNOSIS — D689 Coagulation defect, unspecified: Secondary | ICD-10-CM

## 2018-12-03 DIAGNOSIS — B351 Tinea unguium: Secondary | ICD-10-CM

## 2018-12-03 DIAGNOSIS — M79676 Pain in unspecified toe(s): Secondary | ICD-10-CM

## 2018-12-03 DIAGNOSIS — E119 Type 2 diabetes mellitus without complications: Secondary | ICD-10-CM | POA: Diagnosis not present

## 2018-12-03 NOTE — Progress Notes (Signed)
Complaint:  Visit Type: Patient returns to my office for continued preventative foot care services. Complaint: Patient states" my nails have grown long and thick and become painful to walk and wear shoes" . The patient presents for preventative foot care services.  Patient is diabetic type 2. No changes to ROS.  Patient is taking eliquiss.  Podiatric Exam: Vascular: dorsalis pedis and posterior tibial pulses are palpable bilateral. Capillary return is immediate. Temperature gradient is WNL. Skin turgor WNL  Sensorium: Normal Semmes Weinstein monofilament test. Normal tactile sensation bilaterally. Nail Exam: Pt has thick disfigured discolored nails with subungual debris noted bilateral entire nail hallux through fifth toenails Ulcer Exam: There is no evidence of ulcer or pre-ulcerative changes or infection. Orthopedic Exam: Muscle tone and strength are WNL. No limitations in general ROM. No crepitus or effusions noted. Foot type and digits show no abnormalities. Bony prominences are unremarkable. Skin: No Porokeratosis. No infection or ulcers  Diagnosis:  Onychomycosis, , Pain in right toe, pain in left toes  Treatment & Plan Procedures and Treatment: Consent by patient was obtained for treatment procedures. The patient understood the discussion of treatment and procedures well. All questions were answered thoroughly reviewed. Debridement of mycotic and hypertrophic toenails, 1 through 5 bilateral and clearing of subungual debris. No ulceration, no infection noted.  Return Visit-Office Procedure: Patient instructed to return to the office for a follow up visit 4 months for continued evaluation and treatment.    Alexsandro Salek DPM 

## 2019-03-25 ENCOUNTER — Ambulatory Visit (INDEPENDENT_AMBULATORY_CARE_PROVIDER_SITE_OTHER): Payer: Medicare PPO | Admitting: Podiatry

## 2019-03-25 ENCOUNTER — Encounter: Payer: Self-pay | Admitting: Podiatry

## 2019-03-25 ENCOUNTER — Other Ambulatory Visit: Payer: Self-pay

## 2019-03-25 VITALS — Temp 97.6°F

## 2019-03-25 DIAGNOSIS — M79676 Pain in unspecified toe(s): Secondary | ICD-10-CM | POA: Diagnosis not present

## 2019-03-25 DIAGNOSIS — D689 Coagulation defect, unspecified: Secondary | ICD-10-CM | POA: Diagnosis not present

## 2019-03-25 DIAGNOSIS — E119 Type 2 diabetes mellitus without complications: Secondary | ICD-10-CM | POA: Diagnosis not present

## 2019-03-25 DIAGNOSIS — B351 Tinea unguium: Secondary | ICD-10-CM | POA: Diagnosis not present

## 2019-03-25 NOTE — Progress Notes (Signed)
This patient presents to the office for at risk care this patient requires his care by her professional since this patient will be at risk due to having diabetes patient is unable to self treat her own toenail since she cannot reach her nails.  This patient presents for at risk care today.  General Appearance  Alert, conversant and in no acute stress.  Vascular  Dorsalis pedis and posterior tibial  pulses are palpable  bilaterally.  Capillary return is within normal limits  bilaterally. Temperature is within normal limits  bilaterally.  Neurologic  Senn-Weinstein monofilament wire test within normal limits  bilaterally. Muscle power within normal limits bilaterally.  Nails Thick disfigured discolored nails with subungual debris  from hallux to fifth toes bilaterally. No evidence of bacterial infection or drainage bilaterally.  Orthopedic  No limitations of motion  feet .  No crepitus or effusions noted.  No bony pathology or digital deformities noted.  Skin  normotropic skin with no porokeratosis noted bilaterally.  No signs of infections or ulcers noted.    Onychomycosis  Diabetes  Debridement and Grinding all nails  B/L.  Told this patient to return for periodic foot evaluation to reduce potential risk complications.  RTC 4 months   Gardiner Barefoot DPM

## 2019-04-01 ENCOUNTER — Ambulatory Visit: Payer: Medicare PPO | Admitting: Podiatry

## 2019-06-07 ENCOUNTER — Other Ambulatory Visit: Payer: Self-pay | Admitting: Family Medicine

## 2019-06-07 DIAGNOSIS — Z1231 Encounter for screening mammogram for malignant neoplasm of breast: Secondary | ICD-10-CM

## 2019-07-25 ENCOUNTER — Ambulatory Visit
Admission: RE | Admit: 2019-07-25 | Discharge: 2019-07-25 | Disposition: A | Discharge status: Home / Self Care | Payer: Medicare PPO | Source: Ambulatory Visit | Attending: Family Medicine | Admitting: Family Medicine

## 2019-07-25 DIAGNOSIS — Z1231 Encounter for screening mammogram for malignant neoplasm of breast: Secondary | ICD-10-CM | POA: Insufficient documentation

## 2019-08-06 DIAGNOSIS — E6609 Other obesity due to excess calories: Secondary | ICD-10-CM | POA: Insufficient documentation

## 2019-08-26 ENCOUNTER — Ambulatory Visit (INDEPENDENT_AMBULATORY_CARE_PROVIDER_SITE_OTHER): Payer: Medicare PPO | Admitting: Podiatry

## 2019-08-26 ENCOUNTER — Encounter: Payer: Self-pay | Admitting: Podiatry

## 2019-08-26 ENCOUNTER — Other Ambulatory Visit: Payer: Self-pay

## 2019-08-26 DIAGNOSIS — E119 Type 2 diabetes mellitus without complications: Secondary | ICD-10-CM | POA: Diagnosis not present

## 2019-08-26 DIAGNOSIS — B351 Tinea unguium: Secondary | ICD-10-CM

## 2019-08-26 DIAGNOSIS — M79676 Pain in unspecified toe(s): Secondary | ICD-10-CM | POA: Diagnosis not present

## 2019-08-26 DIAGNOSIS — D689 Coagulation defect, unspecified: Secondary | ICD-10-CM

## 2019-08-26 NOTE — Progress Notes (Signed)
This patient presents to the office for at risk care this patient requires his care by her professional since this patient will be at risk due to having diabetes patient is unable to self treat her own toenail since she cannot reach her nails.  This patient presents for at risk care today.  General Appearance  Alert, conversant and in no acute stress.  Vascular  Dorsalis pedis and posterior tibial  pulses are palpable  bilaterally.  Capillary return is within normal limits  bilaterally. Temperature is within normal limits  bilaterally.  Neurologic  Senn-Weinstein monofilament wire test within normal limits  bilaterally. Muscle power within normal limits bilaterally.  Nails Thick disfigured discolored nails with subungual debris  from hallux to fifth toes bilaterally. No evidence of bacterial infection or drainage bilaterally.  Orthopedic  No limitations of motion  feet .  No crepitus or effusions noted.  No bony pathology or digital deformities noted.  Skin  normotropic skin with no porokeratosis noted bilaterally.  No signs of infections or ulcers noted.    Onychomycosis  Diabetes  Debridement of nails using a nail nipper and dremel tool..  Told this patient to return for periodic foot evaluation to reduce potential risk complications.  RTC 4 months   Myka Lukins DPM 

## 2019-12-30 ENCOUNTER — Encounter: Payer: Self-pay | Admitting: Podiatry

## 2019-12-30 ENCOUNTER — Ambulatory Visit (INDEPENDENT_AMBULATORY_CARE_PROVIDER_SITE_OTHER): Payer: Medicare PPO | Admitting: Podiatry

## 2019-12-30 ENCOUNTER — Other Ambulatory Visit: Payer: Self-pay

## 2019-12-30 DIAGNOSIS — E119 Type 2 diabetes mellitus without complications: Secondary | ICD-10-CM | POA: Diagnosis not present

## 2019-12-30 DIAGNOSIS — M79676 Pain in unspecified toe(s): Secondary | ICD-10-CM | POA: Diagnosis not present

## 2019-12-30 DIAGNOSIS — D689 Coagulation defect, unspecified: Secondary | ICD-10-CM | POA: Diagnosis not present

## 2019-12-30 DIAGNOSIS — B351 Tinea unguium: Secondary | ICD-10-CM | POA: Diagnosis not present

## 2019-12-30 NOTE — Progress Notes (Signed)
This patient presents to the office for at risk care this patient requires his care by her professional since this patient will be at risk due to having diabetes patient is unable to self treat her own toenail since she cannot reach her nails.  This patient presents for at risk care today.  General Appearance  Alert, conversant and in no acute stress.  Vascular  Dorsalis pedis and posterior tibial  pulses are palpable  bilaterally.  Capillary return is within normal limits  bilaterally. Temperature is within normal limits  bilaterally.  Neurologic  Senn-Weinstein monofilament wire test within normal limits  bilaterally. Muscle power within normal limits bilaterally.  Nails Thick disfigured discolored nails with subungual debris  from hallux to fifth toes bilaterally. No evidence of bacterial infection or drainage bilaterally.  Orthopedic  No limitations of motion  feet .  No crepitus or effusions noted.  No bony pathology or digital deformities noted.  Skin  normotropic skin with no porokeratosis noted bilaterally.  No signs of infections or ulcers noted.    Onychomycosis  Diabetes  Debridement of nails using a nail nipper and dremel tool..  Told this patient to return for periodic foot evaluation to reduce potential risk complications.  RTC 4 months   Alfreddie Consalvo DPM 

## 2019-12-31 ENCOUNTER — Ambulatory Visit (INDEPENDENT_AMBULATORY_CARE_PROVIDER_SITE_OTHER): Payer: Medicare PPO | Admitting: Advanced Practice Midwife

## 2019-12-31 ENCOUNTER — Encounter: Payer: Self-pay | Admitting: Advanced Practice Midwife

## 2019-12-31 VITALS — BP 148/70 | Ht 61.0 in | Wt 182.0 lb

## 2019-12-31 DIAGNOSIS — D281 Benign neoplasm of vagina: Secondary | ICD-10-CM | POA: Diagnosis not present

## 2019-12-31 DIAGNOSIS — Z01419 Encounter for gynecological examination (general) (routine) without abnormal findings: Secondary | ICD-10-CM

## 2020-01-01 ENCOUNTER — Encounter: Payer: Self-pay | Admitting: Advanced Practice Midwife

## 2020-01-01 NOTE — Progress Notes (Signed)
Gynecology Annual Exam  PCP: Maryland Pink, MD  Chief Complaint:  Chief Complaint  Patient presents with  . Gynecologic Exam    No concerns    History of Present Illness:Patient is a 84 y.o. G2P2 presents for annual exam. The patient has no complaints today. She does mention a vaginal bump that was evaluated a few years ago and not found to be of concern. She would like to have it looked at again. She denies any pain associated with the bump. She denies any other vaginal or urinary symptoms.   LMP: No LMP recorded. Patient has had a hysterectomy.  The patient is not sexually active. She denies dyspareunia.  The patient does perform self breast exams.  There is no notable family history of breast or ovarian cancer in her family.  The patient wears seatbelts: yes.   The patient has regular exercise: she walks regularly. She admits healthy diet and adequate sleep.    The patient denies current symptoms of depression.     Review of Systems: Review of Systems  Constitutional: Negative for chills and fever.  HENT: Negative for congestion, ear discharge, ear pain, hearing loss, sinus pain and sore throat.   Eyes: Negative for blurred vision and double vision.  Respiratory: Negative for cough, shortness of breath and wheezing.   Cardiovascular: Negative for chest pain, palpitations and leg swelling.  Gastrointestinal: Negative for abdominal pain, blood in stool, constipation, diarrhea, heartburn, melena, nausea and vomiting.  Genitourinary: Negative for dysuria, flank pain, frequency, hematuria and urgency.       Positive for bump in vagina  Musculoskeletal: Negative for back pain, joint pain and myalgias.  Skin: Negative for itching and rash.  Neurological: Negative for dizziness, tingling, tremors, sensory change, speech change, focal weakness, seizures, loss of consciousness, weakness and headaches.  Endo/Heme/Allergies: Negative for environmental allergies. Does not bruise/bleed  easily.  Psychiatric/Behavioral: Negative for depression, hallucinations, memory loss, substance abuse and suicidal ideas. The patient is not nervous/anxious and does not have insomnia.     Past Medical History:  Patient Active Problem List   Diagnosis Date Noted  . Class 1 obesity due to excess calories with serious comorbidity and body mass index (BMI) of 34.0 to 34.9 in adult 08/06/2019  . Absolute anemia 07/17/2014  . A-fib ([place]) 07/17/2014  . Cataract 07/17/2014  . Diabetes ([place]) 07/17/2014  . Glaucoma 07/17/2014  . Cephalalgia 07/17/2014    Overview:  Severe   . HTN (hypertension) 07/17/2014  . Hypothyroidism 07/17/2014  . Insomnia 07/17/2014  . Osteoarthritis 07/17/2014    Overview:  ([place]) a. Cervical spine  b. Hands Overview:  CERVICAL SPINE AND HANDS  Overview:  ([place]) a. Cervical spine  b. Hands   . Allergic rhinitis, seasonal 07/17/2014  . Herpes zona 07/17/2014  . Breast cancer of upper-outer quadrant of right female breast ([place]) 01/17/2010    Overview:  a. Lumpectomy  b. Radiation  c. Femara  Overview:  LUMPECTOMY AND RADIATION     Past Surgical History:  Past Surgical History:  Procedure Laterality Date  . APPENDECTOMY    . BREAST BIOPSY Right 2012   Invasive Carcinoma  . BREAST BIOPSY Left 2013   NEG  . BREAST LUMPECTOMY Right 2012   BREAST CA  . BREAST SURGERY Right   . EYE SURGERY Bilateral   . FINGER SURGERY  01/2015  . FOOT SURGERY Left   . VAGINAL HYSTERECTOMY      Gynecologic History:  No LMP recorded. Patient  has had a hysterectomy. Last Pap: 2018 Results were:  no abnormalities  Last mammogram: July 2021 Results were: Gillian Shields I  Obstetric History: G2P2  Family History:  Family History  Problem Relation Age of Onset  . Breast cancer Neg Hx     Social History:  Social History   Socioeconomic History  . Marital status: Widowed    Spouse name: Not on file  . Number of children: Not on file  . Years of education: Not on file   . Highest education level: Not on file  Occupational History  . Not on file  Tobacco Use  . Smoking status: Never Smoker  . Smokeless tobacco: Never Used  Vaping Use  . Vaping Use: Never used  Substance and Sexual Activity  . Alcohol use: No  . Drug use: No  . Sexual activity: Not Currently    Birth control/protection: Surgical    Comment: Hysterectomy  Other Topics Concern  . Not on file  Social History Narrative  . Not on file   Social Determinants of Health   Financial Resource Strain: Not on file  Food Insecurity: Not on file  Transportation Needs: Not on file  Physical Activity: Not on file  Stress: Not on file  Social Connections: Not on file  Intimate Partner Violence: Not on file    Allergies:  Allergies  Allergen Reactions  . Antihistamines, Chlorpheniramine-Type Other (See Comments)    Stay awake  . Antihistamines, Diphenhydramine-Type Other (See Comments)    KEEPS AWAKE AND VERY WIRED  . Clemastine Other (See Comments)    KEEPS AWAKE AND VERY WIRED KEEPS AWAKE AND VERY WIRED    Medications: Prior to Admission medications   Medication Sig Start Date End Date Taking? Authorizing Provider  acetaminophen (TYLENOL) 500 MG tablet Take 500 mg by mouth 2 (two) times daily.   Yes [provider]  amLODipine-benazepril (LOTREL) 5-40 MG per capsule Take 1 capsule by mouth 2 (two) times daily.   Yes [provider]  atenolol (TENORMIN) 25 MG tablet Take by mouth daily.   Yes [provider]  atovaquone-proguanil (MALARONE) 250-100 MG TABS tablet Take 1 tablet by mouth daily. Start taking on June 20th, take on full stomach daily. Until complete 02/09/18  Yes Carlyle Basques, MD  azithromycin (ZITHROMAX) 500 MG tablet Take 1 tablet (500 mg total) by mouth daily. If you have 4 watery stools/24hr. Can stop taking if diarrhea stops 02/09/18  Yes Carlyle Basques, MD  Blood Glucose Monitoring Suppl (ONE TOUCH ULTRA 2) w/Device KIT USE AS DIRECTED  TWICE A DAY 12/05/17  Yes [provider]  divalproex (DEPAKOTE ER) 250 MG 24 hr tablet  12/31/14  Yes [provider]  ELIQUIS 5 MG TABS tablet  12/25/14  Yes [provider]  flecainide (TAMBOCOR) 50 MG tablet Take 50 mg by mouth 2 (two) times daily.   Yes [provider]  JANUVIA 100 MG tablet  10/17/17  Yes [provider]  latanoprost (XALATAN) 0.005 % ophthalmic solution Place 1 drop into both eyes at bedtime.   Yes [provider]  Levothyroxine Sodium 50 MCG CAPS Take by mouth daily before breakfast.   Yes [provider]  meloxicam (MOBIC) 15 MG tablet  12/23/14  Yes [provider]  metFORMIN (GLUCOPHAGE) 500 MG tablet Take 1,000 mg by mouth 2 (two) times daily with a meal.   Yes [provider]  metroNIDAZOLE (METROCREAM) 0.75 % cream Apply topically.   Yes [provider]  Multiple Vitamin (MULTIVITAMIN) tablet Take 1 tablet by mouth daily.   Yes [provider]  naproxen (NAPROSYN) 500 MG tablet Take 1 tablet (500 mg total) by mouth 2 (two) times daily with a meal. 12/10/14  Yes Carrie Mew, MD  timolol (TIMOPTIC) 0.5 % ophthalmic solution Place 1 drop into both eyes.  01/12/17  Yes [provider]  Timolol Maleate PF 0.5 % SOLN Apply 1 drop to eye every evening.   Yes [provider]  TRADJENTA 5 MG TABS tablet Take 5 mg by mouth daily.  08/22/16  Yes [provider]  glimepiride (AMARYL) 4 MG tablet Take 4 mg by mouth 2 (two) times daily. 12/12/19   [provider]    Physical Exam Vitals: Blood pressure (!) 148/70, height '5\' 1"'  (1.549 m), weight 182 lb (82.6 kg).  General: NAD HEENT: normocephalic, anicteric Thyroid: no enlargement, no palpable nodules Pulmonary: No increased work of breathing, CTAB Cardiovascular: RRR, distal pulses 2+ Breast: Breast symmetrical, no tenderness, no palpable nodules or masses, no skin or nipple retraction  present, no nipple discharge.  No axillary or supraclavicular lymphadenopathy. Abdomen: NABS, soft, non-tender, non-distended.  Umbilicus without lesions.  No hepatomegaly, splenomegaly or masses palpable. No evidence of hernia  Genitourinary:  External: Normal external female genitalia.  Normal urethral meatus, normal Bartholin's and Skene's glands.    Vagina: Normal vaginal mucosa, no evidence of prolapse. Hard, white, superficial and not tender to palpation cyst like bump, 1 cm x 0.5 cm   Rectal: deferred  Lymphatic: no evidence of inguinal lymphadenopathy Extremities: no edema, erythema, or tenderness Neurologic: Grossly intact Psychiatric: mood appropriate, affect full    Assessment: 84 y.o. G2P2 routine annual exam  Plan: Problem List Items Addressed This Visit   None   Visit Diagnoses    Well woman exam with routine gynecological exam    -  Primary      1) Mammogram - recommend yearly screening mammogram.  Mammogram is ordered per PCP  2) STI screening  was not offered and therefore not obtained  3) ASCCP guidelines and rationale discussed.  Patient opts for discontinue age >92 screening interval  4) Osteoporosis  - per USPTF routine screening DEXA at age 23 Osteopenic per 2017 Bone Density imaging  Consider FDA-approved medical therapies in postmenopausal women and men aged 56 years and older, based on the following: a) A hip or vertebral (clinical or morphometric) fracture b) T-score ? -2.5 at the femoral neck or spine after appropriate evaluation to exclude secondary causes C) Low bone mass (T-score between -1.0 and -2.5 at the femoral neck or spine) and a 10-year probability of a hip fracture ? 3% or a 10-year probability of a major osteoporosis-related fracture ? 20% based on the [place]-adapted WHO algorithm   5) Routine healthcare maintenance including cholesterol, diabetes screening discussed managed by PCP  6) Colonoscopy: last done and was normal when she was 84  y.o. Per MD discontinue screening.  Screening recommended starting at age 63 for average risk individuals, age 77 for individuals deemed at increased risk (including African Americans) and recommended to continue until age 52.  For patient age 36-85 individualized approach is recommended.  Gold standard screening is via colonoscopy, Cologuard screening is an acceptable alternative for patient unwilling or unable to undergo colonoscopy.  "Colorectal cancer screening for average?risk adults: 2018 guideline update from the American Cancer Society"[place]: A Cancer Journal for Clinicians: Jun 15, 2016   7) Return in about 1 year (around 12/30/2020) for  annual established gyn.    Christean Leaf, CNM [facility] 01/01/20, 3:15 PM

## 2020-01-29 DIAGNOSIS — F3172 Bipolar disorder, in full remission, most recent episode hypomanic: Secondary | ICD-10-CM | POA: Diagnosis not present

## 2020-02-10 DIAGNOSIS — E119 Type 2 diabetes mellitus without complications: Secondary | ICD-10-CM | POA: Diagnosis not present

## 2020-02-13 DIAGNOSIS — H401131 Primary open-angle glaucoma, bilateral, mild stage: Secondary | ICD-10-CM | POA: Diagnosis not present

## 2020-02-17 DIAGNOSIS — M1611 Unilateral primary osteoarthritis, right hip: Secondary | ICD-10-CM | POA: Diagnosis not present

## 2020-02-17 DIAGNOSIS — E6609 Other obesity due to excess calories: Secondary | ICD-10-CM | POA: Diagnosis not present

## 2020-02-17 DIAGNOSIS — I10 Essential (primary) hypertension: Secondary | ICD-10-CM | POA: Diagnosis not present

## 2020-02-17 DIAGNOSIS — Z6834 Body mass index (BMI) 34.0-34.9, adult: Secondary | ICD-10-CM | POA: Diagnosis not present

## 2020-02-17 DIAGNOSIS — E119 Type 2 diabetes mellitus without complications: Secondary | ICD-10-CM | POA: Diagnosis not present

## 2020-04-14 DIAGNOSIS — R040 Epistaxis: Secondary | ICD-10-CM | POA: Diagnosis not present

## 2020-05-04 ENCOUNTER — Encounter: Payer: Self-pay | Admitting: Podiatry

## 2020-05-04 ENCOUNTER — Ambulatory Visit (INDEPENDENT_AMBULATORY_CARE_PROVIDER_SITE_OTHER): Payer: HMO | Admitting: Podiatry

## 2020-05-04 ENCOUNTER — Other Ambulatory Visit: Payer: Self-pay

## 2020-05-04 DIAGNOSIS — E119 Type 2 diabetes mellitus without complications: Secondary | ICD-10-CM | POA: Diagnosis not present

## 2020-05-04 DIAGNOSIS — M79676 Pain in unspecified toe(s): Secondary | ICD-10-CM

## 2020-05-04 DIAGNOSIS — B351 Tinea unguium: Secondary | ICD-10-CM | POA: Diagnosis not present

## 2020-05-04 NOTE — Progress Notes (Signed)
This patient presents to the office for at risk care this patient requires his care by her professional since this patient will be at risk due to having diabetes patient is unable to self treat her own toenail since she cannot reach her nails.  This patient presents for at risk care today.  General Appearance  Alert, conversant and in no acute stress.  Vascular  Dorsalis pedis and posterior tibial  pulses are palpable  bilaterally.  Capillary return is within normal limits  bilaterally. Temperature is within normal limits  bilaterally.  Neurologic  Senn-Weinstein monofilament wire test within normal limits  bilaterally. Muscle power within normal limits bilaterally.  Nails Thick disfigured discolored nails with subungual debris  from hallux to fifth toes bilaterally. No evidence of bacterial infection or drainage bilaterally.  Orthopedic  No limitations of motion  feet .  No crepitus or effusions noted.  No bony pathology or digital deformities noted.  Skin  normotropic skin with no porokeratosis noted bilaterally.  No signs of infections or ulcers noted.    Onychomycosis  Diabetes  Debridement of nails using a nail nipper and dremel tool..  Told this patient to return for periodic foot evaluation to reduce potential risk complications.  RTC 4 months   Gardiner Barefoot DPM

## 2020-05-26 ENCOUNTER — Ambulatory Visit: Payer: Medicare PPO

## 2020-06-16 DIAGNOSIS — E119 Type 2 diabetes mellitus without complications: Secondary | ICD-10-CM | POA: Diagnosis not present

## 2020-06-16 DIAGNOSIS — I10 Essential (primary) hypertension: Secondary | ICD-10-CM | POA: Diagnosis not present

## 2020-06-17 ENCOUNTER — Other Ambulatory Visit: Payer: Self-pay | Admitting: Family Medicine

## 2020-06-17 DIAGNOSIS — Z1231 Encounter for screening mammogram for malignant neoplasm of breast: Secondary | ICD-10-CM

## 2020-06-22 DIAGNOSIS — E1165 Type 2 diabetes mellitus with hyperglycemia: Secondary | ICD-10-CM | POA: Diagnosis not present

## 2020-06-22 DIAGNOSIS — C50919 Malignant neoplasm of unspecified site of unspecified female breast: Secondary | ICD-10-CM | POA: Diagnosis not present

## 2020-06-22 DIAGNOSIS — Z0000 Encounter for general adult medical examination without abnormal findings: Secondary | ICD-10-CM | POA: Diagnosis not present

## 2020-06-22 DIAGNOSIS — I10 Essential (primary) hypertension: Secondary | ICD-10-CM | POA: Diagnosis not present

## 2020-06-22 DIAGNOSIS — I4891 Unspecified atrial fibrillation: Secondary | ICD-10-CM | POA: Diagnosis not present

## 2020-07-08 DIAGNOSIS — F3172 Bipolar disorder, in full remission, most recent episode hypomanic: Secondary | ICD-10-CM | POA: Diagnosis not present

## 2020-07-08 DIAGNOSIS — Z79899 Other long term (current) drug therapy: Secondary | ICD-10-CM | POA: Diagnosis not present

## 2020-07-13 DIAGNOSIS — F3172 Bipolar disorder, in full remission, most recent episode hypomanic: Secondary | ICD-10-CM | POA: Diagnosis not present

## 2020-07-27 ENCOUNTER — Other Ambulatory Visit: Payer: Self-pay

## 2020-07-27 ENCOUNTER — Ambulatory Visit
Admission: RE | Admit: 2020-07-27 | Discharge: 2020-07-27 | Disposition: A | Discharge status: Home / Self Care | Payer: HMO | Source: Ambulatory Visit | Attending: Family Medicine | Admitting: Family Medicine

## 2020-07-27 DIAGNOSIS — Z1231 Encounter for screening mammogram for malignant neoplasm of breast: Secondary | ICD-10-CM

## 2020-08-05 DIAGNOSIS — I4891 Unspecified atrial fibrillation: Secondary | ICD-10-CM | POA: Diagnosis not present

## 2020-08-05 DIAGNOSIS — E119 Type 2 diabetes mellitus without complications: Secondary | ICD-10-CM | POA: Diagnosis not present

## 2020-08-05 DIAGNOSIS — I10 Essential (primary) hypertension: Secondary | ICD-10-CM | POA: Diagnosis not present

## 2020-08-19 DIAGNOSIS — H401131 Primary open-angle glaucoma, bilateral, mild stage: Secondary | ICD-10-CM | POA: Diagnosis not present

## 2020-09-03 ENCOUNTER — Encounter: Payer: Self-pay | Admitting: Podiatry

## 2020-09-03 ENCOUNTER — Ambulatory Visit (INDEPENDENT_AMBULATORY_CARE_PROVIDER_SITE_OTHER): Payer: HMO | Admitting: Podiatry

## 2020-09-03 ENCOUNTER — Other Ambulatory Visit: Payer: Self-pay

## 2020-09-03 DIAGNOSIS — M79676 Pain in unspecified toe(s): Secondary | ICD-10-CM

## 2020-09-03 DIAGNOSIS — B351 Tinea unguium: Secondary | ICD-10-CM | POA: Diagnosis not present

## 2020-09-03 DIAGNOSIS — E119 Type 2 diabetes mellitus without complications: Secondary | ICD-10-CM

## 2020-09-03 DIAGNOSIS — D689 Coagulation defect, unspecified: Secondary | ICD-10-CM

## 2020-09-03 NOTE — Progress Notes (Signed)
This patient presents to the office for at risk care this patient requires his care by her professional since this patient will be at risk due to having diabetes and coagulation defect.  Patient is taking eliquis. Patient is unable to self treat her own toenail since she cannot reach her nails.  This patient presents for at risk care today.  General Appearance  Alert, conversant and in no acute stress.  Vascular  Dorsalis pedis and posterior tibial  pulses are palpable  bilaterally.  Capillary return is within normal limits  bilaterally. Temperature is within normal limits  bilaterally.  Neurologic  Senn-Weinstein monofilament wire test within normal limits  bilaterally. Muscle power within normal limits bilaterally.  Nails Thick disfigured discolored nails with subungual debris  from hallux to fifth toes bilaterally. No evidence of bacterial infection or drainage bilaterally.  Orthopedic  No limitations of motion  feet .  No crepitus or effusions noted.  No bony pathology or digital deformities noted.  Skin  normotropic skin with no porokeratosis noted bilaterally.  No signs of infections or ulcers noted.    Onychomycosis  Diabetes  Debridement of nails using a nail nipper and dremel tool..  Told this patient to return for periodic foot evaluation to reduce potential risk complications.  RTC 4 months   Gardiner Barefoot DPM

## 2020-09-04 DIAGNOSIS — D225 Melanocytic nevi of trunk: Secondary | ICD-10-CM | POA: Diagnosis not present

## 2020-09-04 DIAGNOSIS — L817 Pigmented purpuric dermatosis: Secondary | ICD-10-CM | POA: Diagnosis not present

## 2020-09-04 DIAGNOSIS — L738 Other specified follicular disorders: Secondary | ICD-10-CM | POA: Diagnosis not present

## 2020-10-19 DIAGNOSIS — E1165 Type 2 diabetes mellitus with hyperglycemia: Secondary | ICD-10-CM | POA: Diagnosis not present

## 2020-10-26 DIAGNOSIS — E039 Hypothyroidism, unspecified: Secondary | ICD-10-CM | POA: Diagnosis not present

## 2020-10-26 DIAGNOSIS — Z6834 Body mass index (BMI) 34.0-34.9, adult: Secondary | ICD-10-CM | POA: Diagnosis not present

## 2020-10-26 DIAGNOSIS — I4891 Unspecified atrial fibrillation: Secondary | ICD-10-CM | POA: Diagnosis not present

## 2020-10-26 DIAGNOSIS — I10 Essential (primary) hypertension: Secondary | ICD-10-CM | POA: Diagnosis not present

## 2020-10-26 DIAGNOSIS — E6609 Other obesity due to excess calories: Secondary | ICD-10-CM | POA: Diagnosis not present

## 2020-10-26 DIAGNOSIS — E119 Type 2 diabetes mellitus without complications: Secondary | ICD-10-CM | POA: Diagnosis not present

## 2020-11-02 DIAGNOSIS — F3172 Bipolar disorder, in full remission, most recent episode hypomanic: Secondary | ICD-10-CM | POA: Diagnosis not present

## 2020-11-03 DIAGNOSIS — M545 Low back pain, unspecified: Secondary | ICD-10-CM | POA: Diagnosis not present

## 2020-11-13 DIAGNOSIS — M545 Low back pain, unspecified: Secondary | ICD-10-CM | POA: Diagnosis not present

## 2020-11-18 DIAGNOSIS — M545 Low back pain, unspecified: Secondary | ICD-10-CM | POA: Diagnosis not present

## 2020-12-21 DIAGNOSIS — M545 Low back pain, unspecified: Secondary | ICD-10-CM | POA: Diagnosis not present

## 2021-01-04 ENCOUNTER — Ambulatory Visit (INDEPENDENT_AMBULATORY_CARE_PROVIDER_SITE_OTHER): Payer: HMO | Admitting: Podiatry

## 2021-01-04 ENCOUNTER — Other Ambulatory Visit: Payer: Self-pay

## 2021-01-04 ENCOUNTER — Encounter: Payer: Self-pay | Admitting: Podiatry

## 2021-01-04 DIAGNOSIS — M79676 Pain in unspecified toe(s): Secondary | ICD-10-CM | POA: Diagnosis not present

## 2021-01-04 DIAGNOSIS — D689 Coagulation defect, unspecified: Secondary | ICD-10-CM

## 2021-01-04 DIAGNOSIS — E119 Type 2 diabetes mellitus without complications: Secondary | ICD-10-CM | POA: Diagnosis not present

## 2021-01-04 DIAGNOSIS — B351 Tinea unguium: Secondary | ICD-10-CM

## 2021-01-04 NOTE — Progress Notes (Signed)
This patient presents to the office for at risk care this patient requires his care by her professional since this patient will be at risk due to having diabetes and coagulation defect.  Patient is taking eliquis. Patient is unable to self treat her own toenail since she cannot reach her nails.  This patient presents for at risk care today.  General Appearance  Alert, conversant and in no acute stress.  Vascular  Dorsalis pedis and posterior tibial  pulses are palpable  bilaterally.  Capillary return is within normal limits  bilaterally. Temperature is within normal limits  bilaterally.  Neurologic  Senn-Weinstein monofilament wire test within normal limits  bilaterally. Muscle power within normal limits bilaterally.  Nails Thick disfigured discolored nails with subungual debris  from hallux to fifth toes bilaterally. No evidence of bacterial infection or drainage bilaterally.  Orthopedic  No limitations of motion  feet .  No crepitus or effusions noted.  No bony pathology or digital deformities noted.  Skin  normotropic skin with no porokeratosis noted bilaterally.  No signs of infections or ulcers noted.    Onychomycosis  Diabetes  Debridement of nails using a nail nipper and dremel tool..  Told this patient to return for periodic foot evaluation to reduce potential risk complications.  RTC 4 months   Gardiner Barefoot DPM

## 2021-01-06 ENCOUNTER — Encounter: Payer: Self-pay | Admitting: Advanced Practice Midwife

## 2021-01-06 ENCOUNTER — Other Ambulatory Visit: Payer: Self-pay

## 2021-01-06 ENCOUNTER — Ambulatory Visit (INDEPENDENT_AMBULATORY_CARE_PROVIDER_SITE_OTHER): Payer: HMO | Admitting: Advanced Practice Midwife

## 2021-01-06 VITALS — BP 128/74 | Ht 61.0 in | Wt 175.4 lb

## 2021-01-06 DIAGNOSIS — Z0000 Encounter for general adult medical examination without abnormal findings: Secondary | ICD-10-CM

## 2021-01-06 NOTE — Progress Notes (Signed)
Gynecology Annual Exam  PCP: Maryland Pink, MD  Chief Complaint:  Chief Complaint  Patient presents with   Gynecologic Exam    History of Present Illness:Patient is a 85 y.o. G2P2 presents for annual exam. The patient has no complaints today.   LMP: No LMP recorded. Patient has had a hysterectomy.   The patient is not sexually active. She denies dyspareunia.  The patient does perform self breast exams.  There is no notable family history of breast or ovarian cancer in her family.  The patient wears seatbelts: yes.   The patient has regular exercise:  she walks 2 days per week, she admits generally healthy diet, adequate hydration and adequate sleep .    The patient denies current symptoms of depression.     Review of Systems: Review of Systems  Constitutional:  Negative for chills and fever.  HENT:  Negative for congestion, ear discharge, ear pain, hearing loss, sinus pain and sore throat.   Eyes:  Negative for blurred vision and double vision.  Respiratory:  Negative for cough, shortness of breath and wheezing.   Cardiovascular:  Negative for chest pain, palpitations and leg swelling.  Gastrointestinal:  Negative for abdominal pain, blood in stool, constipation, diarrhea, heartburn, melena, nausea and vomiting.  Genitourinary:  Negative for dysuria, flank pain, frequency, hematuria and urgency.  Musculoskeletal:  Negative for back pain, joint pain and myalgias.  Skin:  Negative for itching and rash.  Neurological:  Negative for dizziness, tingling, tremors, sensory change, speech change, focal weakness, seizures, loss of consciousness, weakness and headaches.  Endo/Heme/Allergies:  Negative for environmental allergies. Does not bruise/bleed easily.  Psychiatric/Behavioral:  Negative for depression, hallucinations, memory loss, substance abuse and suicidal ideas. The patient is not nervous/anxious and does not have insomnia.    Past Medical History:  Patient Active Problem  List   Diagnosis Date Noted   Class 1 obesity due to excess calories with serious comorbidity and body mass index (BMI) of 34.0 to 34.9 in adult 08/06/2019   Absolute anemia 07/17/2014   A-fib ([place]) 07/17/2014   Cataract 07/17/2014   Diabetes ([place]) 07/17/2014   Glaucoma 07/17/2014   Cephalalgia 07/17/2014    Overview:  Severe    HTN (hypertension) 07/17/2014   Hypothyroidism 07/17/2014   Insomnia 07/17/2014   Osteoarthritis 07/17/2014    Overview:  ([place]) a. Cervical spine  b. Hands Overview:  CERVICAL SPINE AND HANDS  Overview:  ([place]) a. Cervical spine  b. Hands    Allergic rhinitis, seasonal 07/17/2014   Herpes zona 07/17/2014   Breast cancer of upper-outer quadrant of right female breast ([place]) 01/17/2010    Overview:  a. Lumpectomy  b. Radiation  c. Femara  Overview:  LUMPECTOMY AND RADIATION     Past Surgical History:  Past Surgical History:  Procedure Laterality Date   APPENDECTOMY     BREAST BIOPSY Right 2012   Invasive Carcinoma   BREAST BIOPSY Left 2013   NEG   BREAST LUMPECTOMY Right 2012   BREAST CA   BREAST SURGERY Right    EYE SURGERY Bilateral    FINGER SURGERY  01/2015   FOOT SURGERY Left    VAGINAL HYSTERECTOMY      Gynecologic History:  No LMP recorded. Patient has had a hysterectomy. Last Pap: 2018 Results were:  no abnormalities  Last mammogram: 5 months ago Results were: BI-RAD I  Obstetric History: G2P2  Family History:  Family History  Problem Relation Age of Onset   Breast  cancer Neg Hx     Social History:  Social History   Socioeconomic History   Marital status: Widowed    Spouse name: Not on file   Number of children: Not on file   Years of education: Not on file   Highest education level: Not on file  Occupational History   Not on file  Tobacco Use   Smoking status: Never   Smokeless tobacco: Never  Vaping Use   Vaping Use: Never used  Substance and Sexual Activity   Alcohol use: No   Drug use: No   Sexual  activity: Not Currently    Birth control/protection: Surgical    Comment: Hysterectomy  Other Topics Concern   Not on file  Social History Narrative   Not on file   Social Determinants of Health   Financial Resource Strain: Not on file  Food Insecurity: Not on file  Transportation Needs: Not on file  Physical Activity: Not on file  Stress: Not on file  Social Connections: Not on file  Intimate Partner Violence: Not on file    Allergies:  Allergies  Allergen Reactions   Antihistamines, Chlorpheniramine-Type Other (See Comments)    Stay awake   Antihistamines, Diphenhydramine-Type Other (See Comments)    KEEPS AWAKE AND VERY WIRED   Clemastine Other (See Comments)    KEEPS AWAKE AND VERY WIRED KEEPS AWAKE AND VERY WIRED    Medications: Prior to Admission medications   Medication Sig Start Date End Date Taking? Authorizing Provider  acetaminophen (TYLENOL) 500 MG tablet Take 500 mg by mouth 2 (two) times daily.   Yes [provider]  amLODipine-benazepril (LOTREL) 5-40 MG per capsule Take 1 capsule by mouth 2 (two) times daily.   Yes [provider]  atenolol (TENORMIN) 25 MG tablet Take by mouth daily.   Yes [provider]  atovaquone-proguanil (MALARONE) 250-100 MG TABS tablet Take 1 tablet by mouth daily. Start taking on June 20th, take on full stomach daily. Until complete 02/09/18  Yes Carlyle Basques, MD  azithromycin (ZITHROMAX) 500 MG tablet Take 1 tablet (500 mg total) by mouth daily. If you have 4 watery stools/24hr. Can stop taking if diarrhea stops 02/09/18  Yes Carlyle Basques, MD  Blood Glucose Monitoring Suppl (ONE TOUCH ULTRA 2) w/Device KIT USE AS DIRECTED TWICE A DAY 12/05/17  Yes [provider]  divalproex (DEPAKOTE ER) 250 MG 24 hr tablet  12/31/14  Yes [provider]  ELIQUIS 5 MG TABS tablet  12/25/14  Yes [provider]  flecainide (TAMBOCOR) 50 MG tablet Take 50 mg by mouth 2 (two) times daily.   Yes  [provider]  glimepiride (AMARYL) 4 MG tablet Take 4 mg by mouth 2 (two) times daily. 12/12/19  Yes [provider]  JANUVIA 100 MG tablet  10/17/17  Yes [provider]  latanoprost (XALATAN) 0.005 % ophthalmic solution Place 1 drop into both eyes at bedtime.   Yes [provider]  Levothyroxine Sodium 50 MCG CAPS Take by mouth daily before breakfast.   Yes [provider]  meloxicam (MOBIC) 15 MG tablet  12/23/14  Yes [provider]  metFORMIN (GLUCOPHAGE) 500 MG tablet Take 1,000 mg by mouth 2 (two) times daily with a meal.   Yes [provider]  metroNIDAZOLE (METROCREAM) 0.75 % cream Apply topically.   Yes [provider]  Multiple Vitamin (MULTIVITAMIN) tablet Take 1 tablet by mouth daily.   Yes [provider]  naproxen (NAPROSYN) 500 MG  tablet Take 1 tablet (500 mg total) by mouth 2 (two) times daily with a meal. 12/10/14  Yes Carrie Mew, MD  timolol (TIMOPTIC) 0.5 % ophthalmic solution Place 1 drop into both eyes.  01/12/17  Yes [provider]  Timolol Maleate PF 0.5 % SOLN Apply 1 drop to eye every evening.   Yes [provider]  TRADJENTA 5 MG TABS tablet Take 5 mg by mouth daily.  08/22/16  Yes [provider]    Physical Exam Vitals: Blood pressure 128/74, height _0  (1.549 m), weight 175 lb 6.4 oz (79.6 kg).  General: NAD HEENT: normocephalic, anicteric Thyroid: no enlargement, no palpable nodules Pulmonary: No increased work of breathing, CTAB Cardiovascular: RRR, distal pulses 2+ Breast: Breast symmetrical, no tenderness, no palpable nodules or masses, no skin or nipple retraction present, no nipple discharge.  No axillary or supraclavicular lymphadenopathy. Abdomen: NABS, soft, non-tender, non-distended.  Umbilicus without lesions.  No hepatomegaly, splenomegaly or masses palpable. No evidence of hernia  Genitourinary:  External: Normal external female  genitalia.  Normal urethral meatus, normal Bartholin's and Skene's glands.    Vagina: Normal vaginal mucosa, no evidence of prolapse.    Cervix: Grossly normal in appearance, no bleeding  Uterus: Non-enlarged, mobile, normal contour.  No CMT  Adnexa: ovaries non-enlarged, no adnexal masses  Rectal: deferred  Lymphatic: no evidence of inguinal lymphadenopathy Extremities: no edema, erythema, or tenderness Neurologic: Grossly intact Psychiatric: mood appropriate, affect full  Female chaperone present for pelvic and breast  portions of the physical exam     Assessment: 85 y.o. G2P2 routine annual exam  Plan: Problem List Items Addressed This Visit   None Visit Diagnoses     Well woman exam without gynecological exam    -  Primary       1) Mammogram - recommend yearly screening mammogram.  Mammogram  scheduled by PCP  2) STI screening  was notoffered and therefore not obtained  3) ASCCP guidelines and rationale discussed.  Patient opts for discontinue age >70 screening interval  4) Osteoporosis: Dx Osteopenia/last scan 2 years ago  - per USPTF routine screening DEXA at age 50 - FRAX 81 year major fracture risk 83,  10 year hip fracture risk 2.9  Consider FDA-approved medical therapies in postmenopausal women and men aged 28 years and older, based on the following: a) A hip or vertebral (clinical or morphometric) fracture b) T-score ? -2.5 at the femoral neck or spine after appropriate evaluation to exclude secondary causes C) Low bone mass (T-score between -1.0 and -2.5 at the femoral neck or spine) and a 10-year probability of a hip fracture ? 3% or a 10-year probability of a major osteoporosis-related fracture ? 20% based on the [place]-adapted WHO algorithm   5) Routine healthcare maintenance including cholesterol, diabetes screening discussed managed by PCP  6) Colonoscopy managed by PCP.  Screening recommended starting at age 28 for average risk individuals, age 37 for  individuals deemed at increased risk (including African Americans) and recommended to continue until age 83.  For patient age 62-85 individualized approach is recommended.  Gold standard screening is via colonoscopy, Cologuard screening is an acceptable alternative for patient unwilling or unable to undergo colonoscopy.  "Colorectal cancer screening for average?risk adults: 2018 guideline update from the [employer]: A Cancer Journal for Clinicians: Jun 15, 2016   7) Return in about 2 years (around 01/07/2023) for annual established gyn.    Christean Leaf, [facility] 01/06/21, 11:56 AM

## 2021-02-01 DIAGNOSIS — F3172 Bipolar disorder, in full remission, most recent episode hypomanic: Secondary | ICD-10-CM | POA: Diagnosis not present

## 2021-02-03 DIAGNOSIS — H401131 Primary open-angle glaucoma, bilateral, mild stage: Secondary | ICD-10-CM | POA: Diagnosis not present

## 2021-02-22 DIAGNOSIS — E119 Type 2 diabetes mellitus without complications: Secondary | ICD-10-CM | POA: Diagnosis not present

## 2021-03-01 DIAGNOSIS — C50919 Malignant neoplasm of unspecified site of unspecified female breast: Secondary | ICD-10-CM | POA: Diagnosis not present

## 2021-03-01 DIAGNOSIS — M720 Palmar fascial fibromatosis [Dupuytren]: Secondary | ICD-10-CM | POA: Diagnosis not present

## 2021-03-01 DIAGNOSIS — E1165 Type 2 diabetes mellitus with hyperglycemia: Secondary | ICD-10-CM | POA: Diagnosis not present

## 2021-03-01 DIAGNOSIS — I4891 Unspecified atrial fibrillation: Secondary | ICD-10-CM | POA: Diagnosis not present

## 2021-03-01 DIAGNOSIS — E119 Type 2 diabetes mellitus without complications: Secondary | ICD-10-CM | POA: Diagnosis not present

## 2021-03-01 DIAGNOSIS — I10 Essential (primary) hypertension: Secondary | ICD-10-CM | POA: Diagnosis not present

## 2021-03-01 DIAGNOSIS — D689 Coagulation defect, unspecified: Secondary | ICD-10-CM | POA: Diagnosis not present

## 2021-03-30 DIAGNOSIS — M720 Palmar fascial fibromatosis [Dupuytren]: Secondary | ICD-10-CM | POA: Diagnosis not present

## 2021-05-04 DIAGNOSIS — M720 Palmar fascial fibromatosis [Dupuytren]: Secondary | ICD-10-CM | POA: Diagnosis not present

## 2021-05-10 ENCOUNTER — Ambulatory Visit: Payer: HMO | Admitting: Podiatry

## 2021-05-26 DIAGNOSIS — F3172 Bipolar disorder, in full remission, most recent episode hypomanic: Secondary | ICD-10-CM | POA: Diagnosis not present

## 2021-06-09 DIAGNOSIS — I872 Venous insufficiency (chronic) (peripheral): Secondary | ICD-10-CM | POA: Diagnosis not present

## 2021-06-09 DIAGNOSIS — L309 Dermatitis, unspecified: Secondary | ICD-10-CM | POA: Diagnosis not present

## 2021-06-09 DIAGNOSIS — B351 Tinea unguium: Secondary | ICD-10-CM | POA: Diagnosis not present

## 2021-06-16 DIAGNOSIS — F3172 Bipolar disorder, in full remission, most recent episode hypomanic: Secondary | ICD-10-CM | POA: Diagnosis not present

## 2021-06-17 ENCOUNTER — Ambulatory Visit (INDEPENDENT_AMBULATORY_CARE_PROVIDER_SITE_OTHER): Payer: HMO | Admitting: Podiatry

## 2021-06-17 ENCOUNTER — Encounter: Payer: Self-pay | Admitting: Podiatry

## 2021-06-17 DIAGNOSIS — E119 Type 2 diabetes mellitus without complications: Secondary | ICD-10-CM | POA: Diagnosis not present

## 2021-06-17 DIAGNOSIS — M79676 Pain in unspecified toe(s): Secondary | ICD-10-CM

## 2021-06-17 DIAGNOSIS — D689 Coagulation defect, unspecified: Secondary | ICD-10-CM

## 2021-06-17 DIAGNOSIS — B351 Tinea unguium: Secondary | ICD-10-CM | POA: Diagnosis not present

## 2021-06-17 NOTE — Progress Notes (Signed)
This patient presents to the office for at risk care this patient requires his care by her professional since this patient will be at risk due to having diabetes and coagulation defect.  Patient is taking eliquis. Patient is unable to self treat her own toenail since she cannot reach her nails.  This patient presents for at risk care today.  General Appearance  Alert, conversant and in no acute stress.  Vascular  Dorsalis pedis and posterior tibial  pulses are palpable  bilaterally.  Capillary return is within normal limits  bilaterally. Temperature is within normal limits  bilaterally.  Neurologic  Senn-Weinstein monofilament wire test within normal limits  bilaterally. Muscle power within normal limits bilaterally.  Nails Thick disfigured discolored nails with subungual debris  from hallux to fifth toes bilaterally. No evidence of bacterial infection or drainage bilaterally.  Orthopedic  No limitations of motion  feet .  No crepitus or effusions noted.  No bony pathology or digital deformities noted.  Skin  normotropic skin with no porokeratosis noted bilaterally.  No signs of infections or ulcers noted.    Onychomycosis  Diabetes  Debridement of nails using a nail nipper and dremel tool..  Told this patient to return for periodic foot evaluation to reduce potential risk complications.  RTC 5 months   Ryn Peine DPM 

## 2021-06-21 ENCOUNTER — Other Ambulatory Visit: Payer: Self-pay | Admitting: Family Medicine

## 2021-06-21 DIAGNOSIS — Z1231 Encounter for screening mammogram for malignant neoplasm of breast: Secondary | ICD-10-CM

## 2021-07-07 DIAGNOSIS — I10 Essential (primary) hypertension: Secondary | ICD-10-CM | POA: Diagnosis not present

## 2021-07-07 DIAGNOSIS — E119 Type 2 diabetes mellitus without complications: Secondary | ICD-10-CM | POA: Diagnosis not present

## 2021-07-14 DIAGNOSIS — M1990 Unspecified osteoarthritis, unspecified site: Secondary | ICD-10-CM | POA: Diagnosis not present

## 2021-07-14 DIAGNOSIS — E119 Type 2 diabetes mellitus without complications: Secondary | ICD-10-CM | POA: Diagnosis not present

## 2021-07-14 DIAGNOSIS — I4891 Unspecified atrial fibrillation: Secondary | ICD-10-CM | POA: Diagnosis not present

## 2021-07-14 DIAGNOSIS — I10 Essential (primary) hypertension: Secondary | ICD-10-CM | POA: Diagnosis not present

## 2021-07-14 DIAGNOSIS — Z0000 Encounter for general adult medical examination without abnormal findings: Secondary | ICD-10-CM | POA: Diagnosis not present

## 2021-07-14 DIAGNOSIS — E039 Hypothyroidism, unspecified: Secondary | ICD-10-CM | POA: Diagnosis not present

## 2021-07-28 ENCOUNTER — Ambulatory Visit
Admission: RE | Admit: 2021-07-28 | Discharge: 2021-07-28 | Disposition: A | Discharge status: Home / Self Care | Payer: HMO | Source: Ambulatory Visit | Attending: Family Medicine | Admitting: Family Medicine

## 2021-07-28 DIAGNOSIS — Z1231 Encounter for screening mammogram for malignant neoplasm of breast: Secondary | ICD-10-CM | POA: Diagnosis not present

## 2021-08-05 DIAGNOSIS — I10 Essential (primary) hypertension: Secondary | ICD-10-CM | POA: Diagnosis not present

## 2021-08-05 DIAGNOSIS — I480 Paroxysmal atrial fibrillation: Secondary | ICD-10-CM | POA: Diagnosis not present

## 2021-08-18 DIAGNOSIS — H401131 Primary open-angle glaucoma, bilateral, mild stage: Secondary | ICD-10-CM | POA: Diagnosis not present

## 2021-08-30 DIAGNOSIS — I480 Paroxysmal atrial fibrillation: Secondary | ICD-10-CM | POA: Diagnosis not present

## 2021-09-08 DIAGNOSIS — S80862A Insect bite (nonvenomous), left lower leg, initial encounter: Secondary | ICD-10-CM | POA: Diagnosis not present

## 2021-09-08 DIAGNOSIS — L981 Factitial dermatitis: Secondary | ICD-10-CM | POA: Diagnosis not present

## 2021-09-08 DIAGNOSIS — S80861A Insect bite (nonvenomous), right lower leg, initial encounter: Secondary | ICD-10-CM | POA: Diagnosis not present

## 2021-09-08 DIAGNOSIS — D1801 Hemangioma of skin and subcutaneous tissue: Secondary | ICD-10-CM | POA: Diagnosis not present

## 2021-09-17 DIAGNOSIS — H401131 Primary open-angle glaucoma, bilateral, mild stage: Secondary | ICD-10-CM | POA: Diagnosis not present

## 2021-09-21 DIAGNOSIS — H60332 Swimmer's ear, left ear: Secondary | ICD-10-CM | POA: Diagnosis not present

## 2021-09-29 DIAGNOSIS — Z79899 Other long term (current) drug therapy: Secondary | ICD-10-CM | POA: Diagnosis not present

## 2021-09-29 DIAGNOSIS — F3172 Bipolar disorder, in full remission, most recent episode hypomanic: Secondary | ICD-10-CM | POA: Diagnosis not present

## 2021-09-30 DIAGNOSIS — H401131 Primary open-angle glaucoma, bilateral, mild stage: Secondary | ICD-10-CM | POA: Diagnosis not present

## 2021-10-14 DIAGNOSIS — H401131 Primary open-angle glaucoma, bilateral, mild stage: Secondary | ICD-10-CM | POA: Diagnosis not present

## 2021-11-18 ENCOUNTER — Ambulatory Visit (INDEPENDENT_AMBULATORY_CARE_PROVIDER_SITE_OTHER): Payer: HMO | Admitting: Podiatry

## 2021-11-18 ENCOUNTER — Encounter: Payer: Self-pay | Admitting: Podiatry

## 2021-11-18 DIAGNOSIS — D689 Coagulation defect, unspecified: Secondary | ICD-10-CM

## 2021-11-18 DIAGNOSIS — B351 Tinea unguium: Secondary | ICD-10-CM | POA: Diagnosis not present

## 2021-11-18 DIAGNOSIS — E119 Type 2 diabetes mellitus without complications: Secondary | ICD-10-CM | POA: Diagnosis not present

## 2021-11-18 DIAGNOSIS — M79676 Pain in unspecified toe(s): Secondary | ICD-10-CM

## 2021-11-18 NOTE — Progress Notes (Signed)
This patient presents to the office for at risk care this patient requires his care by her professional since this patient will be at risk due to having diabetes and coagulation defect.  Patient is taking eliquis. Patient is unable to self treat her own toenail since she cannot reach her nails.  This patient presents for at risk care today.  General Appearance  Alert, conversant and in no acute stress.  Vascular  Dorsalis pedis and posterior tibial  pulses are palpable  bilaterally.  Capillary return is within normal limits  bilaterally. Temperature is within normal limits  bilaterally.  Neurologic  Senn-Weinstein monofilament wire test within normal limits  bilaterally. Muscle power within normal limits bilaterally.  Nails Thick disfigured discolored nails with subungual debris  from hallux to fifth toes bilaterally. No evidence of bacterial infection or drainage bilaterally.  Orthopedic  No limitations of motion  feet .  No crepitus or effusions noted.  No bony pathology or digital deformities noted.  Skin  normotropic skin with no porokeratosis noted bilaterally.  No signs of infections or ulcers noted.    Onychomycosis  Diabetes  Debridement of nails using a nail nipper and dremel tool..  Told this patient to return for periodic foot evaluation to reduce potential risk complications.  RTC 5 months   Gardiner Barefoot DPM

## 2022-01-26 DIAGNOSIS — Z79899 Other long term (current) drug therapy: Secondary | ICD-10-CM | POA: Diagnosis not present

## 2022-01-26 DIAGNOSIS — F3172 Bipolar disorder, in full remission, most recent episode hypomanic: Secondary | ICD-10-CM | POA: Diagnosis not present

## 2022-02-10 DIAGNOSIS — H401113 Primary open-angle glaucoma, right eye, severe stage: Secondary | ICD-10-CM | POA: Diagnosis not present

## 2022-03-08 DIAGNOSIS — I10 Essential (primary) hypertension: Secondary | ICD-10-CM | POA: Diagnosis not present

## 2022-03-08 DIAGNOSIS — E119 Type 2 diabetes mellitus without complications: Secondary | ICD-10-CM | POA: Diagnosis not present

## 2022-03-08 DIAGNOSIS — I4891 Unspecified atrial fibrillation: Secondary | ICD-10-CM | POA: Diagnosis not present

## 2022-03-16 DIAGNOSIS — Z961 Presence of intraocular lens: Secondary | ICD-10-CM | POA: Diagnosis not present

## 2022-03-16 DIAGNOSIS — H401121 Primary open-angle glaucoma, left eye, mild stage: Secondary | ICD-10-CM | POA: Diagnosis not present

## 2022-03-16 DIAGNOSIS — H401113 Primary open-angle glaucoma, right eye, severe stage: Secondary | ICD-10-CM | POA: Diagnosis not present

## 2022-04-21 ENCOUNTER — Encounter: Payer: Self-pay | Admitting: Podiatry

## 2022-04-21 ENCOUNTER — Ambulatory Visit (INDEPENDENT_AMBULATORY_CARE_PROVIDER_SITE_OTHER): Payer: PPO | Admitting: Podiatry

## 2022-04-21 DIAGNOSIS — E119 Type 2 diabetes mellitus without complications: Secondary | ICD-10-CM | POA: Diagnosis not present

## 2022-04-21 DIAGNOSIS — B351 Tinea unguium: Secondary | ICD-10-CM | POA: Diagnosis not present

## 2022-04-21 DIAGNOSIS — M79676 Pain in unspecified toe(s): Secondary | ICD-10-CM

## 2022-04-21 NOTE — Progress Notes (Signed)
This patient presents to the office for at risk care this patient requires his care by her professional since this patient will be at risk due to having diabetes and coagulation defect.  Patient is taking eliquis. Patient is unable to self treat her own toenail since she cannot reach her nails.  This patient presents for at risk care today.  General Appearance  Alert, conversant and in no acute stress.  Vascular  Dorsalis pedis and posterior tibial  pulses are palpable  bilaterally.  Capillary return is within normal limits  bilaterally. Temperature is within normal limits  bilaterally.  Neurologic  Senn-Weinstein monofilament wire test within normal limits  bilaterally. Muscle power within normal limits bilaterally.  Nails Thick disfigured discolored nails with subungual debris  from hallux to fifth toes bilaterally. No evidence of bacterial infection or drainage bilaterally.  Orthopedic  No limitations of motion  feet .  No crepitus or effusions noted.  No bony pathology or digital deformities noted.  Skin  normotropic skin with no porokeratosis noted bilaterally.  No signs of infections or ulcers noted.    Onychomycosis  Diabetes  Debridement of nails using a nail nipper and dremel tool..  Told this patient to return for periodic foot evaluation to reduce potential risk complications.  RTC 5 months   Trayshawn Durkin DPM 

## 2022-05-02 DIAGNOSIS — E1165 Type 2 diabetes mellitus with hyperglycemia: Secondary | ICD-10-CM | POA: Diagnosis not present

## 2022-05-02 DIAGNOSIS — J069 Acute upper respiratory infection, unspecified: Secondary | ICD-10-CM | POA: Diagnosis not present

## 2022-05-02 DIAGNOSIS — I10 Essential (primary) hypertension: Secondary | ICD-10-CM | POA: Diagnosis not present

## 2022-05-02 DIAGNOSIS — R6889 Other general symptoms and signs: Secondary | ICD-10-CM | POA: Diagnosis not present

## 2022-05-02 DIAGNOSIS — I4891 Unspecified atrial fibrillation: Secondary | ICD-10-CM | POA: Diagnosis not present

## 2022-05-24 DIAGNOSIS — S8012XA Contusion of left lower leg, initial encounter: Secondary | ICD-10-CM | POA: Diagnosis not present

## 2022-06-14 DIAGNOSIS — F3172 Bipolar disorder, in full remission, most recent episode hypomanic: Secondary | ICD-10-CM | POA: Diagnosis not present

## 2022-06-14 DIAGNOSIS — H401121 Primary open-angle glaucoma, left eye, mild stage: Secondary | ICD-10-CM | POA: Diagnosis not present

## 2022-06-14 DIAGNOSIS — Z79899 Other long term (current) drug therapy: Secondary | ICD-10-CM | POA: Diagnosis not present

## 2022-06-20 ENCOUNTER — Other Ambulatory Visit: Payer: Self-pay | Admitting: Family Medicine

## 2022-06-20 DIAGNOSIS — Z1231 Encounter for screening mammogram for malignant neoplasm of breast: Secondary | ICD-10-CM

## 2022-06-22 DIAGNOSIS — H401121 Primary open-angle glaucoma, left eye, mild stage: Secondary | ICD-10-CM | POA: Diagnosis not present

## 2022-06-22 DIAGNOSIS — H401113 Primary open-angle glaucoma, right eye, severe stage: Secondary | ICD-10-CM | POA: Diagnosis not present

## 2022-06-22 DIAGNOSIS — Z961 Presence of intraocular lens: Secondary | ICD-10-CM | POA: Diagnosis not present

## 2022-06-29 DIAGNOSIS — F3172 Bipolar disorder, in full remission, most recent episode hypomanic: Secondary | ICD-10-CM | POA: Diagnosis not present

## 2022-07-11 DIAGNOSIS — I10 Essential (primary) hypertension: Secondary | ICD-10-CM | POA: Diagnosis not present

## 2022-07-11 DIAGNOSIS — E1165 Type 2 diabetes mellitus with hyperglycemia: Secondary | ICD-10-CM | POA: Diagnosis not present

## 2022-07-11 DIAGNOSIS — E119 Type 2 diabetes mellitus without complications: Secondary | ICD-10-CM | POA: Diagnosis not present

## 2022-07-20 DIAGNOSIS — Z0000 Encounter for general adult medical examination without abnormal findings: Secondary | ICD-10-CM | POA: Diagnosis not present

## 2022-07-20 DIAGNOSIS — D689 Coagulation defect, unspecified: Secondary | ICD-10-CM | POA: Diagnosis not present

## 2022-07-20 DIAGNOSIS — C50919 Malignant neoplasm of unspecified site of unspecified female breast: Secondary | ICD-10-CM | POA: Diagnosis not present

## 2022-07-20 DIAGNOSIS — E039 Hypothyroidism, unspecified: Secondary | ICD-10-CM | POA: Diagnosis not present

## 2022-07-20 DIAGNOSIS — I4891 Unspecified atrial fibrillation: Secondary | ICD-10-CM | POA: Diagnosis not present

## 2022-07-20 DIAGNOSIS — D696 Thrombocytopenia, unspecified: Secondary | ICD-10-CM | POA: Diagnosis not present

## 2022-07-20 DIAGNOSIS — I10 Essential (primary) hypertension: Secondary | ICD-10-CM | POA: Diagnosis not present

## 2022-07-20 DIAGNOSIS — E119 Type 2 diabetes mellitus without complications: Secondary | ICD-10-CM | POA: Diagnosis not present

## 2022-08-01 ENCOUNTER — Ambulatory Visit
Admission: RE | Admit: 2022-08-01 | Discharge: 2022-08-01 | Disposition: A | Discharge status: Home / Self Care | Payer: HMO | Source: Ambulatory Visit | Attending: Family Medicine | Admitting: Family Medicine

## 2022-08-01 DIAGNOSIS — Z1231 Encounter for screening mammogram for malignant neoplasm of breast: Secondary | ICD-10-CM | POA: Insufficient documentation

## 2022-08-02 ENCOUNTER — Other Ambulatory Visit: Payer: Self-pay

## 2022-08-02 ENCOUNTER — Inpatient Hospital Stay
Admission: EM | Admit: 2022-08-02 | Discharge: 2022-08-04 | DRG: 812 | Disposition: A | Discharge status: Skilled Nursing Facility | Payer: HMO | Source: Skilled Nursing Facility | Attending: Hospitalist | Admitting: Hospitalist

## 2022-08-02 DIAGNOSIS — R58 Hemorrhage, not elsewhere classified: Secondary | ICD-10-CM | POA: Diagnosis not present

## 2022-08-02 DIAGNOSIS — D7589 Other specified diseases of blood and blood-forming organs: Secondary | ICD-10-CM | POA: Diagnosis present

## 2022-08-02 DIAGNOSIS — E039 Hypothyroidism, unspecified: Secondary | ICD-10-CM | POA: Diagnosis present

## 2022-08-02 DIAGNOSIS — R571 Hypovolemic shock: Secondary | ICD-10-CM

## 2022-08-02 DIAGNOSIS — I491 Atrial premature depolarization: Secondary | ICD-10-CM | POA: Diagnosis not present

## 2022-08-02 DIAGNOSIS — Z923 Personal history of irradiation: Secondary | ICD-10-CM

## 2022-08-02 DIAGNOSIS — I482 Chronic atrial fibrillation, unspecified: Secondary | ICD-10-CM | POA: Diagnosis not present

## 2022-08-02 DIAGNOSIS — D689 Coagulation defect, unspecified: Secondary | ICD-10-CM

## 2022-08-02 DIAGNOSIS — Z7984 Long term (current) use of oral hypoglycemic drugs: Secondary | ICD-10-CM

## 2022-08-02 DIAGNOSIS — D62 Acute posthemorrhagic anemia: Secondary | ICD-10-CM | POA: Diagnosis not present

## 2022-08-02 DIAGNOSIS — E119 Type 2 diabetes mellitus without complications: Secondary | ICD-10-CM | POA: Diagnosis not present

## 2022-08-02 DIAGNOSIS — Z79890 Hormone replacement therapy (postmenopausal): Secondary | ICD-10-CM

## 2022-08-02 DIAGNOSIS — Z853 Personal history of malignant neoplasm of breast: Secondary | ICD-10-CM

## 2022-08-02 DIAGNOSIS — I4891 Unspecified atrial fibrillation: Secondary | ICD-10-CM | POA: Diagnosis present

## 2022-08-02 DIAGNOSIS — I480 Paroxysmal atrial fibrillation: Secondary | ICD-10-CM | POA: Diagnosis present

## 2022-08-02 DIAGNOSIS — K068 Other specified disorders of gingiva and edentulous alveolar ridge: Secondary | ICD-10-CM | POA: Diagnosis present

## 2022-08-02 DIAGNOSIS — R739 Hyperglycemia, unspecified: Secondary | ICD-10-CM | POA: Diagnosis not present

## 2022-08-02 DIAGNOSIS — Z888 Allergy status to other drugs, medicaments and biological substances status: Secondary | ICD-10-CM

## 2022-08-02 DIAGNOSIS — R55 Syncope and collapse: Secondary | ICD-10-CM | POA: Diagnosis not present

## 2022-08-02 DIAGNOSIS — T45515A Adverse effect of anticoagulants, initial encounter: Secondary | ICD-10-CM | POA: Diagnosis present

## 2022-08-02 DIAGNOSIS — D500 Iron deficiency anemia secondary to blood loss (chronic): Secondary | ICD-10-CM | POA: Diagnosis not present

## 2022-08-02 DIAGNOSIS — Z7901 Long term (current) use of anticoagulants: Secondary | ICD-10-CM

## 2022-08-02 DIAGNOSIS — D696 Thrombocytopenia, unspecified: Secondary | ICD-10-CM | POA: Diagnosis present

## 2022-08-02 DIAGNOSIS — I10 Essential (primary) hypertension: Secondary | ICD-10-CM | POA: Diagnosis not present

## 2022-08-02 DIAGNOSIS — H409 Unspecified glaucoma: Secondary | ICD-10-CM | POA: Diagnosis present

## 2022-08-02 DIAGNOSIS — Z79899 Other long term (current) drug therapy: Secondary | ICD-10-CM

## 2022-08-02 DIAGNOSIS — I499 Cardiac arrhythmia, unspecified: Secondary | ICD-10-CM | POA: Diagnosis not present

## 2022-08-02 LAB — HEMOGLOBIN AND HEMATOCRIT, BLOOD
HCT: 27.3 % — ABNORMAL LOW (ref 36.0–46.0)
Hemoglobin: 9 g/dL — ABNORMAL LOW (ref 12.0–15.0)

## 2022-08-02 LAB — CBC WITH DIFFERENTIAL/PLATELET
Abs Immature Granulocytes: 0.02 10*3/uL (ref 0.00–0.07)
Basophils Absolute: 0 10*3/uL (ref 0.0–0.1)
Basophils Relative: 0 %
Eosinophils Absolute: 0 10*3/uL (ref 0.0–0.5)
Eosinophils Relative: 0 %
HCT: 32.2 % — ABNORMAL LOW (ref 36.0–46.0)
Hemoglobin: 10.2 g/dL — ABNORMAL LOW (ref 12.0–15.0)
Immature Granulocytes: 0 %
Lymphocytes Relative: 17 %
Lymphs Abs: 1.6 10*3/uL (ref 0.7–4.0)
MCH: 32.7 pg (ref 26.0–34.0)
MCHC: 31.7 g/dL (ref 30.0–36.0)
MCV: 103.2 fL — ABNORMAL HIGH (ref 80.0–100.0)
Monocytes Absolute: 0.5 10*3/uL (ref 0.1–1.0)
Monocytes Relative: 6 %
Neutro Abs: 7 10*3/uL (ref 1.7–7.7)
Neutrophils Relative %: 77 %
Platelets: 146 10*3/uL — ABNORMAL LOW (ref 150–400)
RBC: 3.12 MIL/uL — ABNORMAL LOW (ref 3.87–5.11)
RDW: 13.3 % (ref 11.5–15.5)
WBC: 9.3 10*3/uL (ref 4.0–10.5)
nRBC: 0 % (ref 0.0–0.2)

## 2022-08-02 LAB — BASIC METABOLIC PANEL
Anion gap: 8 (ref 5–15)
BUN: 28 mg/dL — ABNORMAL HIGH (ref 8–23)
CO2: 24 mmol/L (ref 22–32)
Calcium: 9 mg/dL (ref 8.9–10.3)
Chloride: 105 mmol/L (ref 98–111)
Creatinine, Ser: 0.66 mg/dL (ref 0.44–1.00)
GFR, Estimated: >60 mL/min (ref >60)
Glucose, Bld: 220 mg/dL — ABNORMAL HIGH (ref 70–99)
Potassium: 3.8 mmol/L (ref 3.5–5.1)
Sodium: 137 mmol/L (ref 135–145)

## 2022-08-02 LAB — TROPONIN I (HIGH SENSITIVITY)
Troponin I (High Sensitivity): 6 ng/L (ref <18)
Troponin I (High Sensitivity): 4 ng/L (ref <18)

## 2022-08-02 LAB — GLUCOSE, CAPILLARY: Glucose-Capillary: 164 mg/dL — ABNORMAL HIGH (ref 70–99)

## 2022-08-02 MED ORDER — ACETAMINOPHEN 650 MG RE SUPP: 650.0000 mg | Freq: Four times a day (QID) | RECTAL | Status: DC | PRN

## 2022-08-02 MED ORDER — LEVOTHYROXINE SODIUM 50 MCG PO TABS
50.0000 ug | ORAL_TABLET | Freq: Every day | ORAL | Status: DC
  Administered 2022-08-03 – 2022-08-04 (×2): 50 ug via ORAL
  Filled 2022-08-02 (×2): qty 1

## 2022-08-02 MED ORDER — ONDANSETRON HCL 4 MG/2ML IJ SOLN: 4.0000 mg | Freq: Four times a day (QID) | INTRAMUSCULAR | Status: DC | PRN

## 2022-08-02 MED ORDER — SODIUM CHLORIDE 0.9 % IV BOLUS
500.0000 mL | Freq: Once | INTRAVENOUS | Status: AC
  Administered 2022-08-02: 500 mL via INTRAVENOUS

## 2022-08-02 MED ORDER — TIMOLOL MALEATE 0.5 % OP SOLN
1.0000 [drp] | Freq: Every evening | OPHTHALMIC | Status: DC
  Filled 2022-08-02 (×2): qty 5

## 2022-08-02 MED ORDER — DIVALPROEX SODIUM ER 500 MG PO TB24
750.0000 mg | ORAL_TABLET | Freq: Every day | ORAL | Status: DC
  Administered 2022-08-02 – 2022-08-03 (×2): 750 mg via ORAL
  Filled 2022-08-02: qty 1
  Filled 2022-08-02: qty 3

## 2022-08-02 MED ORDER — SODIUM CHLORIDE 0.9 % IV SOLN: INTRAVENOUS | Status: DC

## 2022-08-02 MED ORDER — TIMOLOL MALEATE 0.5 % OP SOLN: 1.0000 [drp] | Freq: Every day | OPHTHALMIC | Status: DC

## 2022-08-02 MED ORDER — TRANEXAMIC ACID FOR EPISTAXIS
500.0000 mg | Freq: Once | TOPICAL | Status: AC
  Administered 2022-08-02: 500 mg via TOPICAL
  Filled 2022-08-02: qty 10

## 2022-08-02 MED ORDER — INSULIN ASPART 100 UNIT/ML IJ SOLN: 0.0000 [IU] | Freq: Every day | INTRAMUSCULAR | Status: DC

## 2022-08-02 MED ORDER — BOOST / RESOURCE BREEZE PO LIQD CUSTOM
1.0000 | Freq: Three times a day (TID) | ORAL | Status: DC
  Administered 2022-08-02 – 2022-08-03 (×2): 1 via ORAL

## 2022-08-02 MED ORDER — ATENOLOL 25 MG PO TABS
25.0000 mg | ORAL_TABLET | Freq: Every day | ORAL | Status: DC
  Administered 2022-08-03 – 2022-08-04 (×2): 25 mg via ORAL
  Filled 2022-08-02 (×2): qty 1

## 2022-08-02 MED ORDER — ACETAMINOPHEN 325 MG PO TABS
650.0000 mg | ORAL_TABLET | Freq: Four times a day (QID) | ORAL | Status: DC | PRN
  Administered 2022-08-04: 650 mg via ORAL
  Filled 2022-08-02: qty 2

## 2022-08-02 MED ORDER — LATANOPROST 0.005 % OP SOLN
1.0000 [drp] | Freq: Every day | OPHTHALMIC | Status: DC
  Filled 2022-08-02 (×2): qty 2.5

## 2022-08-02 MED ORDER — TRAZODONE HCL 50 MG PO TABS: 25.0000 mg | ORAL_TABLET | Freq: Every evening | ORAL | Status: DC | PRN

## 2022-08-02 MED ORDER — LINAGLIPTIN 5 MG PO TABS
5.0000 mg | ORAL_TABLET | Freq: Every day | ORAL | Status: DC
  Administered 2022-08-03 – 2022-08-04 (×2): 5 mg via ORAL
  Filled 2022-08-02 (×2): qty 1

## 2022-08-02 MED ORDER — FLECAINIDE ACETATE 50 MG PO TABS
50.0000 mg | ORAL_TABLET | Freq: Two times a day (BID) | ORAL | Status: DC
  Filled 2022-08-02: qty 1

## 2022-08-02 MED ORDER — ONDANSETRON HCL 4 MG PO TABS: 4.0000 mg | ORAL_TABLET | Freq: Four times a day (QID) | ORAL | Status: DC | PRN

## 2022-08-02 MED ORDER — MAGNESIUM HYDROXIDE 400 MG/5ML PO SUSP: 30.0000 mL | Freq: Every day | ORAL | Status: DC | PRN

## 2022-08-02 MED ORDER — ADULT MULTIVITAMIN W/MINERALS CH
1.0000 | ORAL_TABLET | Freq: Every day | ORAL | Status: DC
  Administered 2022-08-03 – 2022-08-04 (×2): 1 via ORAL
  Filled 2022-08-02 (×2): qty 1

## 2022-08-02 MED ORDER — GLIMEPIRIDE 4 MG PO TABS
4.0000 mg | ORAL_TABLET | Freq: Two times a day (BID) | ORAL | Status: DC
  Administered 2022-08-03 (×2): 4 mg via ORAL
  Filled 2022-08-02 (×3): qty 1

## 2022-08-02 MED ORDER — INSULIN ASPART 100 UNIT/ML IJ SOLN
0.0000 [IU] | Freq: Three times a day (TID) | INTRAMUSCULAR | Status: DC
  Administered 2022-08-03 (×2): 2 [IU] via SUBCUTANEOUS
  Administered 2022-08-03: 3 [IU] via SUBCUTANEOUS
  Filled 2022-08-02 (×3): qty 1

## 2022-08-02 NOTE — ED Triage Notes (Signed)
Pt to er via ems, per ems pt was at [employer] and fell weak like she was going to pass out. States that she didn't have any syncopal episode but didn't feel well.  EMS states that she was hypotensive on arrival, started and iv, gave some fluids and had a blood sugar of 300.

## 2022-08-02 NOTE — ED Notes (Signed)
Pt continues to have some bleeding from her mouth, states that she had 16 teeth removed and has been bleeding since.

## 2022-08-02 NOTE — Assessment & Plan Note (Signed)
-   We will continue Synthroid. 

## 2022-08-02 NOTE — Assessment & Plan Note (Addendum)
-   The patient will be placed on supplement coverage with NovoLog. - We will continue Januvia/Tradjenta and Amaryl while holding of metformin.

## 2022-08-02 NOTE — H&P (Addendum)
[place]   PATIENT NAME: Lisa Crawford    MR#:  [id number]  DATE OF BIRTH:  [birth year]  DATE OF ADMISSION:  08/02/2022  PRIMARY CARE PHYSICIAN: Jerl Mina, MD   Patient is coming from: Home  REQUESTING/REFERRING PHYSICIAN: Pilar Jarvis, MD  CHIEF COMPLAINT:   Chief Complaint  Patient presents with   Weakness    HISTORY OF PRESENT ILLNESS:  Lisa Crawford is a 87 y.o.Caucasian female with medical history significant for paroxysmal atrial fibrillation on Xarelto, type II diabetes mellitus, breast cancer status post right lumpectomy, who presented to the emergency room with acute onset of generalized weakness and presyncope while at [employer] earlier today.  She experienced lightheadedness while ambulating but never lost consciousness.  She has been bleeding from at the site of 16 extracted teeth on 7/9.  She stopped Xarelto 2 days before and 1 day after her procedure.  She was doing this planning to get dentures.  She denies any chest pain or palpitations or dyspnea or cough or wheezing or hemoptysis.  No recent fever or chills.  No melena or bright red bleeding per rectum.  No dysuria, oliguria or hematuria or flank pain.  No other bleeding diathesis.  She has been taking Xarelto 1 day after her procedure on a regular basis.  ED Course: When she came to the ER, vital signs were within normal.  Labs revealed a blood glucose of 220 and a BUN of 28.  CBC showed hemoglobin 10.2 hematocrit 32.2 with macrocytosis and thrombocytopenia of 146.  Previous H&H were 14.3 and 43.6 however on 01/13/2014.  She had a reportedly most recent hemoglobin of 13. EKG as reviewed by me : EKG showed atrial fibrillation with controlled ventricular response of 78 with low voltage QRS and nonspecific ST segment changes Imaging: None.  The patient was given 500 and mL IV normal saline bolus as well as 500 mg topical tranexamic acid.  She will be admitted to an observation. PAST MEDICAL HISTORY:    Past Medical History:  Diagnosis Date   A-fib (HCC)    Breast cancer (HCC) 2012   RT LUMPECTOMY   Diabetes (HCC)    Personal history of radiation therapy 2012   BREAST CA    PAST SURGICAL HISTORY:   Past Surgical History:  Procedure Laterality Date   APPENDECTOMY     BREAST BIOPSY Right 2012   Invasive Carcinoma   BREAST BIOPSY Left 2013   NEG   BREAST LUMPECTOMY Right 2012   BREAST CA   BREAST SURGERY Right    EYE SURGERY Bilateral    FINGER SURGERY  01/2015   FOOT SURGERY Left    VAGINAL HYSTERECTOMY      SOCIAL HISTORY:   Social History   Tobacco Use   Smoking status: Never   Smokeless tobacco: Never  Substance Use Topics   Alcohol use: No    FAMILY HISTORY:   Family History  Problem Relation Age of Onset   Breast cancer Neg Hx     DRUG ALLERGIES:   Allergies  Allergen Reactions   Antihistamines, Chlorpheniramine-Type Other (See Comments)    Stay awake   Antihistamines, Diphenhydramine-Type Other (See Comments)    KEEPS AWAKE AND VERY WIRED   Clemastine Other (See Comments)    KEEPS AWAKE AND VERY WIRED KEEPS AWAKE AND VERY WIRED    REVIEW OF SYSTEMS:   ROS As per history of present illness. All pertinent systems were reviewed above. Constitutional, HEENT,  cardiovascular, respiratory, GI, GU, musculoskeletal, neuro, psychiatric, endocrine, integumentary and hematologic systems were reviewed and are otherwise negative/unremarkable except for positive findings mentioned above in the HPI.   MEDICATIONS AT HOME:   Prior to Admission medications   Medication Sig Start Date End Date Taking? Authorizing Provider  acetaminophen (TYLENOL) 500 MG tablet Take 500 mg by mouth 2 (two) times daily.    [provider]  amLODipine-benazepril (LOTREL) 5-40 MG per capsule Take 1 capsule by mouth 2 (two) times daily.    [provider]  atenolol (TENORMIN) 25 MG tablet Take by mouth daily.    [provider]  atovaquone-proguanil  (MALARONE) 250-100 MG TABS tablet Take 1 tablet by mouth daily. Start taking on June 20th, take on full stomach daily. Until complete 02/09/18   Judyann Munson, MD  azithromycin (ZITHROMAX) 500 MG tablet Take 1 tablet (500 mg total) by mouth daily. If you have 4 watery stools/24hr. Can stop taking if diarrhea stops 02/09/18   Judyann Munson, MD  Blood Glucose Monitoring Suppl (ONE TOUCH ULTRA 2) w/Device KIT USE AS DIRECTED TWICE A DAY 12/05/17   [provider]  divalproex (DEPAKOTE ER) 250 MG 24 hr tablet  12/31/14   [provider]  ELIQUIS 5 MG TABS tablet  12/25/14   [provider]  flecainide (TAMBOCOR) 50 MG tablet Take 50 mg by mouth 2 (two) times daily.    [provider]  glimepiride (AMARYL) 4 MG tablet Take 4 mg by mouth 2 (two) times daily. 12/12/19   [provider]  JANUVIA 100 MG tablet  10/17/17   [provider]  latanoprost (XALATAN) 0.005 % ophthalmic solution Place 1 drop into both eyes at bedtime.    [provider]  Levothyroxine Sodium 50 MCG CAPS Take by mouth daily before breakfast.    [provider]  meloxicam (MOBIC) 15 MG tablet  12/23/14   [provider]  metFORMIN (GLUCOPHAGE) 500 MG tablet Take 1,000 mg by mouth 2 (two) times daily with a meal.    [provider]  metroNIDAZOLE (METROCREAM) 0.75 % cream Apply topically.    [provider]  Multiple Vitamin (MULTIVITAMIN) tablet Take 1 tablet by mouth daily.    [provider]  naproxen (NAPROSYN) 500 MG tablet Take 1 tablet (500 mg total) by mouth 2 (two) times daily with a meal. 12/10/14   Sharman Cheek, MD  timolol (TIMOPTIC) 0.5 % ophthalmic solution Place 1 drop into both eyes.  01/12/17   [provider]  Timolol Maleate PF 0.5 % SOLN Apply 1 drop to eye every evening.    [provider]  TRADJENTA 5 MG TABS tablet Take 5 mg by mouth daily.  08/22/16   [provider]       VITAL SIGNS:  Blood pressure 130/70, pulse 89, temperature 98.7 F (37.1 C), resp. rate 17, height 4\' 11"  (1.499 m), weight 61.2 kg, SpO2 100%.  PHYSICAL EXAMINATION:  Physical Exam  GENERAL:  87 y.o.-year-old Caucasian female patient lying in the bed with no acute distress.  EYES: Pupils equal, round, reactive to light and accommodation. No scleral icterus.  Mild pallor.  Extraocular muscles intact.  HEENT: Head atraumatic, normocephalic. Oropharynx with current active bloody oozing at the site of extracted teeth and nasopharynx clear.  NECK:  Supple, no jugular venous distention. No thyroid enlargement, no tenderness.  LUNGS: Normal breath sounds bilaterally, no wheezing, rales,rhonchi or crepitation. No use of accessory muscles of respiration.  CARDIOVASCULAR: Regular  rate and rhythm, S1, S2 normal. No murmurs, rubs, or gallops.  ABDOMEN: Soft, nondistended, nontender. Bowel sounds present. No organomegaly or mass.  EXTREMITIES: No pedal edema, cyanosis, or clubbing.  NEUROLOGIC: Cranial nerves II through XII are intact. Muscle strength 5/5 in all extremities. Sensation intact. Gait not checked.  PSYCHIATRIC: The patient is alert and oriented x 3.  Normal affect and good eye contact. SKIN: No obvious rash, lesion, or ulcer.   LABORATORY PANEL:   CBC Recent Labs  Lab 08/02/22 1805  WBC 9.3  HGB 10.2*  HCT 32.2*  PLT 146*   ------------------------------------------------------------------------------------------------------------------  Chemistries  Recent Labs  Lab 08/02/22 1805  NA 137  K 3.8  CL 105  CO2 24  GLUCOSE 220*  BUN 28*  CREATININE 0.66  CALCIUM 9.0   ------------------------------------------------------------------------------------------------------------------  Cardiac Enzymes No results for input(s): "TROPONINI" in the last 168  hours. ------------------------------------------------------------------------------------------------------------------  RADIOLOGY:  MM 3D SCREENING MAMMOGRAM BILATERAL BREAST  Result Date: 08/02/2022 CLINICAL DATA:  Screening. EXAM: DIGITAL SCREENING BILATERAL MAMMOGRAM WITH TOMOSYNTHESIS AND CAD TECHNIQUE: Bilateral screening digital craniocaudal and mediolateral oblique mammograms were obtained. Bilateral screening digital breast tomosynthesis was performed. The images were evaluated with computer-aided detection. COMPARISON:  Previous exam(s). ACR Breast Density Category b: There are scattered areas of fibroglandular density. FINDINGS: There are no findings suspicious for malignancy. IMPRESSION: No mammographic evidence of malignancy. A result letter of this screening mammogram will be mailed directly to the patient. RECOMMENDATION: Screening mammogram in one year. (Code:SM-B-01Y) BI-RADS CATEGORY  1: Negative. Electronically Signed   By: Sande Brothers M.D.   On: 08/02/2022 09:54      IMPRESSION AND PLAN:  Assessment and Plan: * Acute blood loss anemia - This is secondary to gingival bleeding at the site of extracted teeth. - She will be admitted to a medical telemetry observation bed. - We will follow serial hemoglobins and hematocrits. - We will stop her Xarelto and Malarone. - We will repeat tranexamic acid as needed. - She is agreeable with PRBCs transfusion as need be.  Essential hypertension - We will continue her antihypertensive therapy.  Type 2 diabetes mellitus without complications (HCC) - The patient will be placed on supplement coverage with NovoLog. - We will continue Januvia/Tradjenta and Amaryl while holding of metformin.  A-fib [facility]) - This is apparently chronic. - We will have to hold off Eliquis. - We will continue flecainide and atenolol.  Hypothyroidism - We will continue Synthroid.  Glaucoma - We will continue her ophthalmic GTT.   DVT prophylaxis:  SCDs Advanced Care Planning:  Code Status: full code. Family Communication:  The plan of care was discussed in details with the patient (and family). I answered all questions. The patient agreed to proceed with the above mentioned plan. Further management will depend upon hospital course. Disposition Plan: Back to previous home environment Consults called: none. All the records are reviewed and case discussed with ED provider.  Status is: Observation  I certify that at the time of admission, it is my clinical judgment that the patient will require  hospital care extending less than 2 midnights.                            Dispo: The patient is from: Home              Anticipated d/c is to: Home              Patient currently is not  medically stable to d/c.              Difficult to place patient: No  Hannah Beat M.D on 08/02/2022 at 10:43 PM  [facility]   From 7 PM-7 AM, contact night-coverage [email or web address]  CC: Primary care physician; Jerl Mina, MD

## 2022-08-02 NOTE — Assessment & Plan Note (Signed)
 -  We will continue her antihypertensive therapy 

## 2022-08-02 NOTE — Assessment & Plan Note (Addendum)
-   This is apparently chronic. - We will have to hold off Eliquis. - We will continue flecainide and atenolol.

## 2022-08-02 NOTE — Plan of Care (Signed)
Pt is from [facility] independent living. She had 16 teeth removed on 7/9 in preparation of getting dentures. She has had periodic bleeding at the sight since surgery but today she started to experience copious amts of bleeding and its not stopping.  Using gauze to create pressure. Last time she had xarelto was last night. Hgb was 10.2 at 18:00. Dropped to 9.0 at 22:00.

## 2022-08-02 NOTE — Assessment & Plan Note (Signed)
-   We will continue her ophthalmic GTT.

## 2022-08-02 NOTE — Assessment & Plan Note (Addendum)
-   This is secondary to gingival bleeding at the site of extracted teeth. - She will be admitted to a medical telemetry observation bed. - We will follow serial hemoglobins and hematocrits. - We will stop her Xarelto and Malarone. - We will repeat tranexamic acid as needed. - She is agreeable with PRBCs transfusion as need be.

## 2022-08-02 NOTE — ED Notes (Signed)
Pt given sandwich tray and applesauce

## 2022-08-02 NOTE — ED Notes (Signed)
Green, purple, and blue top sent to lab at this time. Per Modesto Charon, MD, if blood level comes back low, to draw a type and screen on pt but wait for now.

## 2022-08-02 NOTE — ED Provider Notes (Signed)
[facility] Provider Note    Event Date/Time   First MD Initiated Contact with Patient 08/02/22 1809     (approximate)   History   Weakness   HPI  Lisa Crawford is a 87 y.o. female   Past medical history of patient on xarelto presents to the emergency department with a presyncopal episode while at [employer] earlier today.  While walking she felt lightheaded did not pass out did not have palpitations chest pain or shortness of breath.  EMS brought her here.  Of note she had had recently teeth pulled couple weeks ago and has ongoing bleeding from the gums for the past week.  She continues to take her anticoagulation.  She has had poor p.o. intake due to her dental procedure.  She denies respiratory infectious symptoms, chest pain, palpitations, abdominal pain, GI or GU complaints.    External Medical Documents Reviewed: Internal medicine office visit dated 07/20/2022 documented past medical history and anticoagulation with Xarelto      Physical Exam   Triage Vital Signs: ED Triage Vitals  Encounter Vitals Group     BP 08/02/22 1800 106/68     Systolic BP Percentile --      Diastolic BP Percentile --      Pulse Rate 08/02/22 1800 80     Resp --      Temp 08/02/22 1800 98.6 F (37 C)     Temp Source 08/02/22 1800 Oral     SpO2 08/02/22 1800 98 %     Weight 08/02/22 1758 132 lb (59.9 kg)     Height 08/02/22 1758 4\' 11"  (1.499 m)     Head Circumference --      Peak Flow --      Pain Score 08/02/22 1758 0     Pain Loc --      Pain Education --      Exclude from Growth Chart --     Most recent vital signs: Vitals:   08/02/22 1800  BP: 106/68  Pulse: 80  Temp: 98.6 F (37 C)  SpO2: 98%    General: Awake, no distress.  CV:  Good peripheral perfusion.  Resp:  Normal effort.  Abd:  No distention.  Other:  Conjunctiva are pale.  Dry mucous membranes poor skin turgor looks slightly dehydrated.  Otherwise awake alert pleasant  oriented no acute distress breathing comfortably.  Hemodynamics appropriate reassuring.  Soft nontender abdomen.  Clear lungs.  She has some bleeding from the gums, no significant bleeding.   ED Results / Procedures / Treatments   Labs (all labs ordered are listed, but only abnormal results are displayed) Labs Reviewed  BASIC METABOLIC PANEL - Abnormal; Notable for the following components:      Result Value   Glucose, Bld 220 (*)    BUN 28 (*)    All other components within normal limits  CBC WITH DIFFERENTIAL/PLATELET - Abnormal; Notable for the following components:   RBC 3.12 (*)    Hemoglobin 10.2 (*)    HCT 32.2 (*)    MCV 103.2 (*)    Platelets 146 (*)    All other components within normal limits  TROPONIN I (HIGH SENSITIVITY)     I ordered and reviewed the above labs they are notable for hemoglobin is 10.2 today from a prior of 13.3 on 07/11/2022  EKG  ED ECG REPORT I, Pilar Jarvis, the attending physician, personally viewed and interpreted this ECG.   Date:  08/02/2022  EKG Time: 1802  Rate: 78  Rhythm: AF  Axis: nl  Intervals:none  ST&T Change: no stemi     PROCEDURES:  Critical Care performed: No  Procedures   MEDICATIONS ORDERED IN ED: Medications  sodium chloride 0.9 % bolus 500 mL (500 mLs Intravenous New Bag/Given 08/02/22 1858)  tranexamic acid (CYKLOKAPRON) 1000 MG/10ML topical solution 500 mg (500 mg Topical Given 08/02/22 1858)    External physician / consultants:  I spoke with hospitalist for admission and regarding care plan for this patient.   IMPRESSION / MDM / ASSESSMENT AND PLAN / ED COURSE  I reviewed the triage vital signs and the nursing notes.                                Patient's presentation is most consistent with acute presentation with potential threat to life or bodily function.  Differential diagnosis includes, but is not limited to, acute blood loss anemia, dehydration, electrolyte disturbance, cardiac arrhythmia  ACS   The patient is on the cardiac monitor to evaluate for evidence of arrhythmia and/or significant heart rate changes.  MDM:    Presyncopal episode today in the setting of ongoing gum bleeding on anticoagulation as her teeth were pulled anticipation for getting dentures last week.  Her hemoglobin has dropped 3 points.  She looks pale, I think this is likely the cause of her presyncopal episode.  She is on Xarelto for atrial fibrillation.  I think is important to hold this medication currently given her ongoing bleeding and drop in H&H.  She is symptomatic.  She is not low enough to warrant a blood transfusion at this time.  Will admit for serial H&H, consultation with cardiologist regarding anticoagulation, will apply TXA/gauze to the gums for hemostasis.         FINAL CLINICAL IMPRESSION(S) / ED DIAGNOSES   Final diagnoses:  Acute blood loss anemia  Pre-syncope     Rx / DC Orders   ED Discharge Orders     None        Note:  This document was prepared using Dragon voice recognition software and may include unintentional dictation errors.    Pilar Jarvis, MD 08/02/22 [phone]

## 2022-08-03 DIAGNOSIS — Z79890 Hormone replacement therapy (postmenopausal): Secondary | ICD-10-CM | POA: Diagnosis not present

## 2022-08-03 DIAGNOSIS — Z923 Personal history of irradiation: Secondary | ICD-10-CM | POA: Diagnosis not present

## 2022-08-03 DIAGNOSIS — Z7901 Long term (current) use of anticoagulants: Secondary | ICD-10-CM | POA: Diagnosis not present

## 2022-08-03 DIAGNOSIS — Z853 Personal history of malignant neoplasm of breast: Secondary | ICD-10-CM | POA: Diagnosis not present

## 2022-08-03 DIAGNOSIS — D696 Thrombocytopenia, unspecified: Secondary | ICD-10-CM | POA: Diagnosis not present

## 2022-08-03 DIAGNOSIS — D689 Coagulation defect, unspecified: Secondary | ICD-10-CM

## 2022-08-03 DIAGNOSIS — E119 Type 2 diabetes mellitus without complications: Secondary | ICD-10-CM | POA: Diagnosis not present

## 2022-08-03 DIAGNOSIS — D7589 Other specified diseases of blood and blood-forming organs: Secondary | ICD-10-CM | POA: Diagnosis not present

## 2022-08-03 DIAGNOSIS — I10 Essential (primary) hypertension: Secondary | ICD-10-CM | POA: Diagnosis not present

## 2022-08-03 DIAGNOSIS — R571 Hypovolemic shock: Secondary | ICD-10-CM

## 2022-08-03 DIAGNOSIS — D62 Acute posthemorrhagic anemia: Secondary | ICD-10-CM | POA: Diagnosis not present

## 2022-08-03 DIAGNOSIS — D500 Iron deficiency anemia secondary to blood loss (chronic): Secondary | ICD-10-CM | POA: Diagnosis present

## 2022-08-03 DIAGNOSIS — K068 Other specified disorders of gingiva and edentulous alveolar ridge: Secondary | ICD-10-CM | POA: Diagnosis not present

## 2022-08-03 DIAGNOSIS — H409 Unspecified glaucoma: Secondary | ICD-10-CM | POA: Diagnosis not present

## 2022-08-03 DIAGNOSIS — Z79899 Other long term (current) drug therapy: Secondary | ICD-10-CM | POA: Diagnosis not present

## 2022-08-03 DIAGNOSIS — T45515A Adverse effect of anticoagulants, initial encounter: Secondary | ICD-10-CM | POA: Diagnosis not present

## 2022-08-03 DIAGNOSIS — R55 Syncope and collapse: Secondary | ICD-10-CM | POA: Diagnosis not present

## 2022-08-03 DIAGNOSIS — I480 Paroxysmal atrial fibrillation: Secondary | ICD-10-CM | POA: Diagnosis not present

## 2022-08-03 DIAGNOSIS — Z888 Allergy status to other drugs, medicaments and biological substances status: Secondary | ICD-10-CM | POA: Diagnosis not present

## 2022-08-03 DIAGNOSIS — E039 Hypothyroidism, unspecified: Secondary | ICD-10-CM | POA: Diagnosis not present

## 2022-08-03 DIAGNOSIS — Z7984 Long term (current) use of oral hypoglycemic drugs: Secondary | ICD-10-CM | POA: Diagnosis not present

## 2022-08-03 LAB — CBC
HCT: 24.1 % — ABNORMAL LOW (ref 36.0–46.0)
Hemoglobin: 7.8 g/dL — ABNORMAL LOW (ref 12.0–15.0)
MCH: 32.4 pg (ref 26.0–34.0)
MCHC: 32.4 g/dL (ref 30.0–36.0)
MCV: 100 fL (ref 80.0–100.0)
Platelets: 138 10*3/uL — ABNORMAL LOW (ref 150–400)
RBC: 2.41 MIL/uL — ABNORMAL LOW (ref 3.87–5.11)
RDW: 13.2 % (ref 11.5–15.5)
WBC: 5.4 10*3/uL (ref 4.0–10.5)
nRBC: 0 % (ref 0.0–0.2)

## 2022-08-03 LAB — BASIC METABOLIC PANEL
Anion gap: 6 (ref 5–15)
BUN: 36 mg/dL — ABNORMAL HIGH (ref 8–23)
CO2: 23 mmol/L (ref 22–32)
Calcium: 7.8 mg/dL — ABNORMAL LOW (ref 8.9–10.3)
Chloride: 109 mmol/L (ref 98–111)
Creatinine, Ser: 0.54 mg/dL (ref 0.44–1.00)
GFR, Estimated: >60 mL/min (ref >60)
Glucose, Bld: 198 mg/dL — ABNORMAL HIGH (ref 70–99)
Potassium: 3.4 mmol/L — ABNORMAL LOW (ref 3.5–5.1)
Sodium: 138 mmol/L (ref 135–145)

## 2022-08-03 LAB — HEMOGLOBIN AND HEMATOCRIT, BLOOD
HCT: 30.4 % — ABNORMAL LOW (ref 36.0–46.0)
Hemoglobin: 10.2 g/dL — ABNORMAL LOW (ref 12.0–15.0)

## 2022-08-03 LAB — MRSA NEXT GEN BY PCR, NASAL: MRSA by PCR Next Gen: NOT DETECTED

## 2022-08-03 LAB — GLUCOSE, CAPILLARY
Glucose-Capillary: 154 mg/dL — ABNORMAL HIGH (ref 70–99)
Glucose-Capillary: 175 mg/dL — ABNORMAL HIGH (ref 70–99)
Glucose-Capillary: 139 mg/dL — ABNORMAL HIGH (ref 70–99)
Glucose-Capillary: 125 mg/dL — ABNORMAL HIGH (ref 70–99)
Glucose-Capillary: 192 mg/dL — ABNORMAL HIGH (ref 70–99)
Glucose-Capillary: 153 mg/dL — ABNORMAL HIGH (ref 70–99)

## 2022-08-03 LAB — APTT: aPTT: 34 seconds (ref 24–36)

## 2022-08-03 LAB — PROTIME-INR
INR: 2.4 — ABNORMAL HIGH (ref 0.8–1.2)
Prothrombin Time: 26.8 seconds — ABNORMAL HIGH (ref 11.4–15.2)

## 2022-08-03 LAB — ABO/RH: ABO/RH(D): O POS

## 2022-08-03 LAB — MAGNESIUM: Magnesium: 1.6 mg/dL — ABNORMAL LOW (ref 1.7–2.4)

## 2022-08-03 LAB — HEMOGLOBIN A1C
Hgb A1c MFr Bld: 6.1 % — ABNORMAL HIGH (ref 4.8–5.6)
Mean Plasma Glucose: 128.37 mg/dL

## 2022-08-03 MED ORDER — MIDODRINE HCL 5 MG PO TABS
10.0000 mg | ORAL_TABLET | Freq: Three times a day (TID) | ORAL | Status: DC
  Administered 2022-08-03 (×4): 10 mg via ORAL
  Filled 2022-08-03 (×4): qty 2

## 2022-08-03 MED ORDER — MAGNESIUM SULFATE 2 GM/50ML IV SOLN
2.0000 g | Freq: Once | INTRAVENOUS | Status: AC
  Administered 2022-08-03: 2 g via INTRAVENOUS
  Filled 2022-08-03 (×2): qty 50

## 2022-08-03 MED ORDER — SODIUM CHLORIDE 0.9% IV SOLUTION: Freq: Once | INTRAVENOUS | Status: AC

## 2022-08-03 MED ORDER — ORAL CARE MOUTH RINSE: 15.0000 mL | OROMUCOSAL | Status: DC | PRN

## 2022-08-03 MED ORDER — SODIUM CHLORIDE 0.9 % IV BOLUS
1000.0000 mL | Freq: Once | INTRAVENOUS | Status: AC
  Administered 2022-08-03: 1000 mL via INTRAVENOUS

## 2022-08-03 MED ORDER — ACETAMINOPHEN 325 MG PO TABS
650.0000 mg | ORAL_TABLET | Freq: Once | ORAL | Status: AC
  Administered 2022-08-03: 650 mg via ORAL
  Filled 2022-08-03: qty 2

## 2022-08-03 MED ORDER — CHLORHEXIDINE GLUCONATE CLOTH 2 % EX PADS
6.0000 | MEDICATED_PAD | Freq: Every day | CUTANEOUS | Status: DC
  Administered 2022-08-03 – 2022-08-04 (×2): 6 via TOPICAL

## 2022-08-03 MED ORDER — ENSURE ENLIVE PO LIQD
237.0000 mL | Freq: Three times a day (TID) | ORAL | Status: DC
  Administered 2022-08-03 – 2022-08-04 (×3): 237 mL via ORAL

## 2022-08-03 MED ORDER — POTASSIUM CHLORIDE 20 MEQ PO PACK
40.0000 meq | PACK | Freq: Once | ORAL | Status: AC
  Administered 2022-08-03: 40 meq via ORAL
  Filled 2022-08-03: qty 2

## 2022-08-03 MED ORDER — VITAMIN C 500 MG PO TABS
500.0000 mg | ORAL_TABLET | Freq: Two times a day (BID) | ORAL | Status: DC
  Administered 2022-08-03 – 2022-08-04 (×2): 500 mg via ORAL
  Filled 2022-08-03 (×2): qty 1

## 2022-08-03 NOTE — Plan of Care (Signed)
Called a rapid on this pt who ambulated to the BR and could not get up.  We got VS and she was very hypotensive and tachycardic.  Blood sugar WNL.  Gave fluid bolus and pt moved to stepdown. Pt still A&O.  MD ordering 2 u PRBC.

## 2022-08-03 NOTE — Significant Event (Signed)
Rapid Response Event Note   Reason for Call :  Hypotension  Initial Focused Assessment:  Patients BP 69/48 HR 100 Patient alert and orient sating 100% on room air with no SOB. Patient spiting up blood clots. Patient looks pale and patient states she feels light headed. Dr Arville Care at bedside. Patients BP before transfer 95/52.    Interventions:  - 1 liter of bolus saline - Lab work - blood transfusion -Transfer to [employer]   MD Notified:  0109 Call Time:0109 Arrival XLKG:[id number] End UVOZ:[id number]  Judyann Munson, RN

## 2022-08-03 NOTE — TOC CM/SW Note (Addendum)
Transition of Care [facility]) - Inpatient Brief Assessment   Patient Details  Name: Lisa Crawford MRN: [id number] Date of Birth: [birth year]  Transition of Care [facility] CM/SW Contact:    Kreg Shropshire, RN Phone Number: 08/03/2022, 11:14 AM   Clinical Narrative: Pt came from [place] and plans on returning to [facility] retirement community. She stated she has a lot of support at [facility]. Denies use of any DME. Pt has PCP in the area as well. Cm will continue to follow for [facility] needs  1424- Nurse from [facility] [phone] informed that [facility] can provide transportation whenever d/c.   Transition of Care Asessment: Insurance and Status: Insurance coverage has been reviewed Patient has primary care physician: Yes Home environment has been reviewed: Indepdent resident at [facility] community and plans on returning Prior level of function:: independent Prior/Current Home Services: No current home services Social Determinants of Health Reivew: SDOH reviewed no interventions necessary Readmission risk has been reviewed: Yes Transition of care needs: no transition of care needs at this time

## 2022-08-03 NOTE — Progress Notes (Addendum)
Critical care note:  Date of note: 08/03/2022  Subjective: Rapid response team was called as the patient dropped her blood pressure to 69/48 with heart rate 100 and pulse currently 100% on room air.  She was spitting up blood clots from her gums at the site of extracted teeth despite second dose of tranexamic acid.  She denies any dyspnea or cough or hemoptysis.  She was looking pale and had lightheadedness and dizziness.  No nausea or vomiting.  No chest pain or palpitations.  No paresthesias or focal muscle weakness.  Objective: Physical examination: Generally: Acutely ill, anxious and pale looking elderly Caucasian female in no respiratory  distress Vital signs: BP is above and later was 69/48 with heart rate of 77 earlier later 180 respiratory rate of 3018 and later 80 and pulse oximetry of 99-100 % on room air and temperature of 97.9 and later 97.3.. Head - atraumatic, normocephalic.  Pupils - equal, round and reactive to light and accommodation. Extraocular movements are intact. No scleral icterus.  Positive pallor. Oropharynx -bleeding gums at the site of extracted teeth with spitted blood clots and blood all over her upper gown.  No pharyngeal erythema or exudate.  Neck - supple. No JVD. Carotid pulses 2+ bilaterally. No carotid bruits. No palpable thyromegaly or lymphadenopathy. Cardiovascular - regular rate and rhythm. Normal S1 and S2. No murmurs, gallops or rubs.  Lungs - clear to auscultation bilaterally.  Abdomen - soft and nontender. Positive bowel sounds. No palpable organomegaly or masses.  Extremities - no pitting edema, clubbing or cyanosis.  Neuro - grossly non-focal. Skin - no rashes. Breast, pelvic and rectal - deferred.   Most recent H&H were 9/27.3 compared to 10.2/32.2 on admission.  Stat repeat CBC showed hemoglobin 7.8 hematocrit 24.1 with platelets 138 and WBCs of 5.4.  INR was 2.4 and PT 26.8.  PTT was 34.  BMP was remarkable for mild hypokalemia of 3.4 and BUN of  36 with a calcium of 7.8.  Blood group was O+ with negative antibody screen.  Assessment/plan: 1.-Acute blood loss anemia due to persistent bleeding at the site of extracted tooth with associated coagulopathy due to Xarelto and subsequent hypotension and hypovolemic shock. - The patient was given 1 L bolus of IV normal saline wide open. - She was ordered 2 units of packed red blood cells. - She was transferred to stepdown unit bed. - We will follow serial H&H. - With current management, BP has improved to 95/56 right prior to transfer and it went up to 124/58 later on. - We will continue to closely monitor her.  Authorized and performed by: Valente David, MD Total critical care time:   30     minutes. Due to a high probability of clinically significant, life-threatening deterioration, the patient required my highest level of preparedness to intervene emergently and I personally spent this critical care time directly and personally managing the patient.  This critical care time included obtaining a history, examining the patient, pulse oximetry, ordering and review of studies, arranging urgent treatment with development of management plan, evaluation of patient's response to treatment, frequent reassessment, and discussions with other providers. This critical care time was performed to assess and manage the high probability of imminent, life-threatening deterioration that could result in multiorgan failure.  It was exclusive of separately billable procedures and treating other patients and teaching time.

## 2022-08-03 NOTE — Progress Notes (Signed)
Initial Nutrition Assessment  DOCUMENTATION CODES:   Not applicable  INTERVENTION:   Ensure Enlive po TID, each supplement provides 350 kcal and 20 grams of protein.  Magic cup TID with meals, each supplement provides 290 kcal and 9 grams of protein  MVI po daily   Vitamin C 500mg  po BID  Pt at high refeed risk; recommend monitor potassium, magnesium and phosphorus labs daily until stable  Daily weights   NUTRITION DIAGNOSIS:   Inadequate oral intake related to inability to eat (pt with recent dental extractions) as evidenced by per patient/family report.  GOAL:   Patient will meet greater than or equal to 90% of their needs  MONITOR:   PO intake, Supplement acceptance, Labs, Weight trends, I & O's, Skin  REASON FOR ASSESSMENT:   Malnutrition Screening Tool    ASSESSMENT:   87 y/o female with h/o Afib, hypothyroidism, HTN, DM, breast cancer s/p lumpectomy/XRT, bipolar disorder and recent dental extractions 7/9 (16 teeth) who is admitted with hypovolemic shock and anemia.  Met with pt in room today. Pt reports decreased appetite and oral intake for the past several months r/t poor dentition. Pt had 16 teeth removed on 7/9 in preparation for dentures. Pt reports that she has mainly been eating applesauce and mashed potatoes; pt has not started drinking any supplements. Pt reports that she has lost ~50lbs over the past year. Per chart, pt appears to be down ~38lbs(22%) since 2022. Pt is down 21lbs(13%) over the past year; this is not significant. Pt reports today that she is unable to chew anything; pt is requesting a pureed diet. RD discussed with pt the importance of adequate nutrition needed to preserve lean muscle. Pt is willing to drink chocolate Ensure in hospital. RD will add supplements and vitamins to help pt meet her estimated needs. Pt is at high refeed risk.   Medications reviewed and include: vitamin C, glimepiride, insulin, synthroid, MVI, NaCl @50ml /hr  Labs  reviewed: K 3.4(L), BUN 36(H), Mg 1.6(L) Hgb 10.2(L), Hct 30.4(L) Cbgs- 125, 139, 175, 154 x 24 hrs  AIC 6.1(H)- 7/16  NUTRITION - FOCUSED PHYSICAL EXAM:  Flowsheet Row Most Recent Value  Orbital Region No depletion  Upper Arm Region No depletion  Thoracic and Lumbar Region No depletion  Buccal Region No depletion  Temple Region Moderate depletion  Clavicle Bone Region Moderate depletion  Clavicle and Acromion Bone Region Moderate depletion  Scapular Bone Region No depletion  Dorsal Hand Mild depletion  Patellar Region Moderate depletion  Anterior Thigh Region Moderate depletion  Posterior Calf Region Moderate depletion  Edema (RD Assessment) None  Hair Reviewed  Eyes Reviewed  Mouth Reviewed  Skin Reviewed  Nails Reviewed   Diet Order:   Diet Order             DIET - DYS 1 Room service appropriate? Yes; Fluid consistency: Thin  Diet effective now                  EDUCATION NEEDS:   Education needs have been addressed  Skin:  Skin Assessment: Reviewed RN Assessment (ecchymosis)  Last BM:  7/14  Height:   Ht Readings from Last 1 Encounters:  08/03/22 4\' 11"  (1.499 m)    Weight:   Wt Readings from Last 1 Encounters:  08/03/22 62.2 kg    Ideal Body Weight:  44.5 kg  BMI:  Body mass index is 27.7 kg/m.  Estimated Nutritional Needs:   Kcal:  1400-1600kcal/day  Protein:  70-80g/day  Fluid:  1.2-1.4L/day  Betsey Holiday MS, RD, LDN Please refer to [facility] for RD and/or RD on-call/weekend/after hours pager

## 2022-08-03 NOTE — Progress Notes (Signed)
  PROGRESS NOTE    Lisa Crawford  XBJ:[id number] DOB: [birth year] DOA: 08/02/2022 PCP: Jerl Mina, MD  122A/122A-BB  LOS: 0 days   Brief hospital course:   Assessment & Plan: Lisa Crawford is a 87 y.o.Caucasian female with medical history significant for paroxysmal atrial fibrillation on Xarelto, type II diabetes mellitus, breast cancer status post right lumpectomy, who presented to the emergency room with acute onset of generalized weakness and presyncope while at [employer] earlier today.  She experienced lightheadedness while ambulating but never lost consciousness.  She has been bleeding from at the site of 16 extracted teeth on 7/9.  She stopped Xarelto 2 days before and 1 day after her procedure.  She was doing this planning to get dentures.    * Acute blood loss anemia - This is secondary to gingival bleeding at the site of extracted teeth. --received tranexamic acid  --2u pRBC today --monitor Hgb --hold Xarelto  Hypotension  Ruled out shock --from blood loss.  S/p 1L bolus f/b MIVF --2u pRBC today --cont midodrine  Essential hypertension --not currently active  Type 2 diabetes mellitus without complications (HCC) - SSI - continue Januvia/Tradjenta and Amaryl while holding of metformin.  A-fib [facility]) - This is apparently chronic. --not on Flecainide PTA --cont atenolol at reduced 25 mg daily --hold Xarelto  Hypothyroidism - We will continue Synthroid.  Glaucoma - We will continue her ophthalmic GTT.   DVT prophylaxis: SCD/Compression stockings Code Status: Full code  Family Communication:  Level of care: Med-Surg Dispo:   The patient is from: home Anticipated d/c is to: home Anticipated d/c date is: tomorrow   Subjective and Interval History:  BP improved.  Pt's gums stopped bleeding.  No pain in the mouth.     Objective: Vitals:   08/03/22 1100 08/03/22 1200 08/03/22 1700 08/03/22 2114  BP: 126/64 (!) 147/57 (!) 116/57 115/72  Pulse: 75  68 85 93  Resp: (!) 26 (!) 22 16   Temp:   97.6 F (36.4 C) 98 F (36.7 C)  TempSrc:   Oral Oral  SpO2: 99% 97% 98% 100%  Weight:      Height:        Intake/Output Summary (Last 24 hours) at 08/03/2022 2315 Last data filed at 08/03/2022 1245 Gross per 24 hour  Intake 3744.37 ml  Output 1151 ml  Net 2593.37 ml   Filed Weights   08/02/22 1758 08/02/22 2100 08/03/22 0200  Weight: 59.9 kg 61.2 kg 62.2 kg    Examination:   Constitutional: NAD, AAOx3 HEENT: conjunctivae and lids normal, EOMI CV: No cyanosis.   RESP: normal respiratory effort, on RA Neuro: II - XII grossly intact.   Psych: Normal mood and affect.  Appropriate judgement and reason   Data Reviewed: I have personally reviewed labs and imaging studies  Time spent: 50 minutes  Darlin Priestly, MD [facility] If 7PM-7AM, please contact night-coverage 08/03/2022, 11:15 PM

## 2022-08-04 DIAGNOSIS — D62 Acute posthemorrhagic anemia: Secondary | ICD-10-CM | POA: Diagnosis not present

## 2022-08-04 LAB — GLUCOSE, CAPILLARY: Glucose-Capillary: 105 mg/dL — ABNORMAL HIGH (ref 70–99)

## 2022-08-04 LAB — BASIC METABOLIC PANEL
Anion gap: 6 (ref 5–15)
BUN: 21 mg/dL (ref 8–23)
CO2: 23 mmol/L (ref 22–32)
Calcium: 8.1 mg/dL — ABNORMAL LOW (ref 8.9–10.3)
Chloride: 110 mmol/L (ref 98–111)
Creatinine, Ser: 0.5 mg/dL (ref 0.44–1.00)
GFR, Estimated: >60 mL/min (ref >60)
Glucose, Bld: 160 mg/dL — ABNORMAL HIGH (ref 70–99)
Potassium: 3.8 mmol/L (ref 3.5–5.1)
Sodium: 139 mmol/L (ref 135–145)

## 2022-08-04 LAB — TYPE AND SCREEN
ABO/RH(D): O POS
Antibody Screen: NEGATIVE
Unit division: 0
Unit division: 0

## 2022-08-04 LAB — BPAM RBC
Blood Product Expiration Date: 202408162359
Blood Product Expiration Date: 202408162359
ISSUE DATE / TIME: 202407170353
ISSUE DATE / TIME: 202407170620
Unit Type and Rh: 5100
Unit Type and Rh: 5100

## 2022-08-04 LAB — CBC
HCT: 30 % — ABNORMAL LOW (ref 36.0–46.0)
Hemoglobin: 10.1 g/dL — ABNORMAL LOW (ref 12.0–15.0)
MCH: 31.9 pg (ref 26.0–34.0)
MCHC: 33.7 g/dL (ref 30.0–36.0)
MCV: 94.6 fL (ref 80.0–100.0)
Platelets: 125 10*3/uL — ABNORMAL LOW (ref 150–400)
RBC: 3.17 MIL/uL — ABNORMAL LOW (ref 3.87–5.11)
RDW: 15.9 % — ABNORMAL HIGH (ref 11.5–15.5)
WBC: 5.4 10*3/uL (ref 4.0–10.5)
nRBC: 0 % (ref 0.0–0.2)

## 2022-08-04 LAB — PHOSPHORUS: Phosphorus: 2.5 mg/dL (ref 2.5–4.6)

## 2022-08-04 LAB — MAGNESIUM: Magnesium: 2.1 mg/dL (ref 1.7–2.4)

## 2022-08-04 LAB — PREPARE RBC (CROSSMATCH)

## 2022-08-04 MED ORDER — XARELTO 20 MG PO TABS: ORAL_TABLET | ORAL | Status: AC

## 2022-08-04 MED ORDER — ACETAMINOPHEN 500 MG PO TABS: 500.0000 mg | ORAL_TABLET | Freq: Two times a day (BID) | ORAL | Status: AC | PRN

## 2022-08-04 MED ORDER — AMLODIPINE BESY-BENAZEPRIL HCL 5-40 MG PO CAPS: ORAL_CAPSULE | ORAL | Status: AC

## 2022-08-04 NOTE — TOC Transition Note (Signed)
Transition of Care [facility]) - CM/SW Discharge Note   Patient Details  Name: Lisa Crawford MRN: [id number] Date of Birth: 06-18-1935  Transition of Care [facility]) CM/SW Contact:  Garret Reddish, RN Phone Number: 08/04/2022, 10:45 AM   Clinical Narrative:   Chart reviewed.  Noted that patient is from [facility] apartment. I have spoken with Engineer, manufacturing, Child psychotherapist at [employer].  Crystal informs me that patient is able to return back to the Independent Living apartment.  Crystal reports that [employer] will pick patient up when she is ready for discharge.  Crystal informed me to call [phone] when patient is ready to go.  Patient will need to be down in the discharge area.    I have informed patient's daughter Shayleen Eppinger that patient would be a discharge for today back to [facility] Independent apartment.  Vernona Rieger has asked that the nurse call her to review discharge instructions and medications.   I have made staff nurse aware of the above information.        Final next level of care: Home/Self Care (Patient will be returning back to [facility] Independent Apartment) Barriers to Discharge: No Barriers Identified   Patient Goals and CMS Choice      Discharge Placement                      Patient and family notified of of transfer: 08/04/22  Discharge Plan and Services Additional resources added to the After Visit Summary for                                       Social Determinants of Health (SDOH) Interventions SDOH Screenings   Food Insecurity: No Food Insecurity (08/02/2022)  Housing: Low Risk  (08/02/2022)  Transportation Needs: No Transportation Needs (08/02/2022)  Utilities: Not At Risk (08/02/2022)  Depression (PHQ2-9): Low Risk  (09/04/2018)  Financial Resource Strain: Low Risk  (07/20/2022)   Received from [facility]  Tobacco Use: Low Risk  (08/02/2022)      Readmission Risk Interventions     No data to display

## 2022-08-04 NOTE — Discharge Summary (Signed)
Physician Discharge Summary   Lisa Crawford  female DOB: 1935-03-26  ZHY:[id number]  PCP: Jerl Mina, MD  Admit date: 08/02/2022 Discharge date: 08/04/2022  Admitted From: home Disposition:  home Daughter updated on the phone prior to discharge. CODE STATUS: Full code  Discharge Instructions     Discharge instructions   Complete by: As directed    For your bleeding gums, you have received tranexamic acid (applied to your gums to help stop the bleeding) and 2 units of blood transfusion.  Please hold your Xarelto for 1 more week before resuming.    Please hold Lotrel until followup with your primary care doctor because your blood pressure was low in the hospital.   Dr. Darlin Priestly [facility] Course:  For full details, please see H&P, progress notes, consult notes and ancillary notes.  Briefly,  Lisa Crawford is a 87 y.o.Caucasian female with medical history significant for paroxysmal atrial fibrillation on Xarelto, type II diabetes mellitus, breast cancer status post right lumpectomy, who presented to the emergency room with acute onset of generalized weakness and presyncope while at [employer].    She experienced lightheadedness while ambulating but never lost consciousness.  She has been bleeding from at the site of 16 extracted teeth on 7/9.  She stopped Xarelto 2 days before and 1 day after her procedure.  She was doing this planning to get dentures.    * Acute blood loss anemia - This is secondary to gingival bleeding at the site of extracted teeth. --Hgb 10.2 on presentation and dropped to low of 7.8.   --received tranexamic acid and 2u pRBC.  Hgb stable around 10's prior to discharge. --Advised to hold Xarelto for 1 more week before resuming.   Hypotension  Ruled out shock --from blood loss.  S/p 1L bolus f/b MIVF and 2u pRBC with improvement in BP.  Received midodrine during hospitalization but not need to continue after discharge since BP  improved.   Essential hypertension --Hold home Lotrel until outpatient f/u.   Type 2 diabetes mellitus without complications (HCC) - SSI --discharged back on home regimen as below.   A-fib [facility]) - This is apparently chronic. --not on Flecainide PTA --atenolol at reduced 25 mg daily during hospitalization while hypotensive.  Discharged on home dose of 50 mg daily. --hold Xarelto for 1 more week before resuming.   Hypothyroidism - continue Synthroid.   Glaucoma - continue her ophthalmic GTT.   Unless noted above, medications under "STOP" list are ones pt was not taking PTA.  Discharge Diagnoses:  Principal Problem:   Acute blood loss anemia Active Problems:   Essential hypertension   Type 2 diabetes mellitus without complications (HCC)   A-fib (HCC)   Glaucoma   Hypothyroidism   Pre-syncope   Hypovolemic shock (HCC)   Coagulopathy (HCC)   30 Day Unplanned Readmission Risk Score    Flowsheet Row ED to Hosp-Admission (Current) from 08/02/2022 in [facility] 1C MEDICAL TELEMETRY  30 Day Unplanned Readmission Risk Score (%) 16.1 Filed at 08/04/2022 0801       This score is the patient's risk of an unplanned readmission within 30 days of being discharged (0 -100%). The score is based on dignosis, age, lab data, medications, orders, and past utilization.   Low:  0-14.9   Medium: 15-21.9   High: 22-29.9   Extreme: 30 and above         Discharge Instructions:  Allergies  as of 08/04/2022       Reactions   Antihistamines, Chlorpheniramine-type Other (See Comments)   Stay awake   Antihistamines, Diphenhydramine-type Other (See Comments)   KEEPS AWAKE AND VERY WIRED   Clemastine Other (See Comments)   KEEPS AWAKE AND VERY WIRED KEEPS AWAKE AND VERY WIRED        Medication List     STOP taking these medications    atovaquone-proguanil 250-100 MG Tabs tablet Commonly known as: Malarone   flecainide 50 MG tablet Commonly known as:  TAMBOCOR   Semaglutide(0.25 or 0.5MG /DOS) 2 MG/1.5ML Sopn   Tradjenta 5 MG Tabs tablet Generic drug: linagliptin       TAKE these medications    acetaminophen 500 MG tablet Commonly known as: TYLENOL Take 1 tablet (500 mg total) by mouth 2 (two) times daily as needed. Home med. What changed:  when to take this reasons to take this additional instructions   amLODipine-benazepril 5-40 MG capsule Commonly known as: LOTREL Hold until followup with your primary care provider due to low blood pressure. What changed:  how much to take how to take this when to take this additional instructions   atenolol 50 MG tablet Commonly known as: TENORMIN Take 50 mg by mouth daily.   divalproex 250 MG 24 hr tablet Commonly known as: DEPAKOTE ER Take 750 mg by mouth at bedtime.   dorzolamide-timolol 2-0.5 % ophthalmic solution Commonly known as: COSOPT Place 1 drop into both eyes 2 (two) times daily.   Dulaglutide 3 MG/0.5ML Sopn Inject 3 mg into the skin once a week.   glimepiride 4 MG tablet Commonly known as: AMARYL Take 4 mg by mouth 2 (two) times daily.   hydrocortisone cream 1 % Apply 1 Application topically 2 (two) times daily.   latanoprost 0.005 % ophthalmic solution Commonly known as: XALATAN Place 1 drop into both eyes at bedtime.   levothyroxine 50 MCG tablet Commonly known as: SYNTHROID Take 50 mcg by mouth daily before breakfast.   metroNIDAZOLE 0.75 % cream Commonly known as: METROCREAM Apply topically.   multivitamin tablet Take 1 tablet by mouth daily.   ONE TOUCH ULTRA 2 w/Device Kit USE AS DIRECTED TWICE A DAY   Rocklatan 0.02-0.005 % Soln Generic drug: Netarsudil-Latanoprost Apply to eye.   timolol 0.5 % ophthalmic solution Commonly known as: TIMOPTIC Place 1 drop into both eyes.   Xarelto 20 MG Tabs tablet Generic drug: rivaroxaban Resume 1 week after discharge, on 08/11/22. What changed:  how much to take how to take this when to  take this additional instructions         Follow-up Information     Jerl Mina, MD Follow up in 1 week(s).   Specialty: Family Medicine Contact information: [address] [place] [address] [phone]                 Allergies  Allergen Reactions   Antihistamines, Chlorpheniramine-Type Other (See Comments)    Stay awake   Antihistamines, Diphenhydramine-Type Other (See Comments)    KEEPS AWAKE AND VERY WIRED   Clemastine Other (See Comments)    KEEPS AWAKE AND VERY WIRED KEEPS AWAKE AND VERY WIRED     The results of significant diagnostics from this hospitalization (including imaging, microbiology, ancillary and laboratory) are listed below for reference.   Consultations:   Procedures/Studies: MM 3D SCREENING MAMMOGRAM BILATERAL BREAST  Result Date: 08/02/2022 CLINICAL DATA:  Screening. EXAM: DIGITAL SCREENING BILATERAL MAMMOGRAM WITH TOMOSYNTHESIS AND CAD TECHNIQUE: Bilateral screening  digital craniocaudal and mediolateral oblique mammograms were obtained. Bilateral screening digital breast tomosynthesis was performed. The images were evaluated with computer-aided detection. COMPARISON:  Previous exam(s). ACR Breast Density Category b: There are scattered areas of fibroglandular density. FINDINGS: There are no findings suspicious for malignancy. IMPRESSION: No mammographic evidence of malignancy. A result letter of this screening mammogram will be mailed directly to the patient. RECOMMENDATION: Screening mammogram in one year. (Code:[id number]-B-01Y) BI-RADS CATEGORY  1: Negative. Electronically Signed   By: Sande Brothers M.D.   On: 08/02/2022 09:54      Labs: BNP (last 3 results) No results for input(s): "BNP" in the last 8760 hours. Basic Metabolic Panel: Recent Labs  Lab 08/02/22 1805 08/03/22 0138 08/04/22 0541  NA 137 138 139  K 3.8 3.4* 3.8  CL 105 109 110  CO2 24 23 23   GLUCOSE 220* 198* 160*  BUN 28* 36* 21  CREATININE 0.66 0.54 0.50   CALCIUM 9.0 7.8* 8.1*  MG  --  1.6* 2.1  PHOS  --   --  2.5   Liver Function Tests: No results for input(s): "AST", "ALT", "ALKPHOS", "BILITOT", "PROT", "ALBUMIN" in the last 168 hours. No results for input(s): "LIPASE", "AMYLASE" in the last 168 hours. No results for input(s): "AMMONIA" in the last 168 hours. CBC: Recent Labs  Lab 08/02/22 1805 08/02/22 2245 08/03/22 0138 08/03/22 1123 08/04/22 0541  WBC 9.3  --  5.4  --  5.4  NEUTROABS 7.0  --   --   --   --   HGB 10.2* 9.0* 7.8* 10.2* 10.1*  HCT 32.2* 27.3* 24.1* 30.4* 30.0*  MCV 103.2*  --  100.0  --  94.6  PLT 146*  --  138*  --  125*   Cardiac Enzymes: No results for input(s): "CKTOTAL", "CKMB", "CKMBINDEX", "TROPONINI" in the last 168 hours. BNP: Invalid input(s): "POCBNP" CBG: Recent Labs  Lab 08/03/22 0747 08/03/22 1200 08/03/22 1702 08/03/22 2118 08/04/22 0839  GLUCAP 139* 125* 192* 153* 105*   D-Dimer No results for input(s): "DDIMER" in the last 72 hours. Hgb A1c Recent Labs    08/02/22 2121  HGBA1C 6.1*   Lipid Profile No results for input(s): "CHOL", "HDL", "LDLCALC", "TRIG", "CHOLHDL", "LDLDIRECT" in the last 72 hours. Thyroid function studies No results for input(s): "TSH", "T4TOTAL", "T3FREE", "THYROIDAB" in the last 72 hours.  Invalid input(s): "FREET3" Anemia work up No results for input(s): "VITAMINB12", "FOLATE", "FERRITIN", "TIBC", "IRON", "RETICCTPCT" in the last 72 hours. Urinalysis    Component Value Date/Time   COLORURINE Amber 07/22/2011 1014   APPEARANCEUR Turbid 07/22/2011 1014   LABSPEC 1.029 07/22/2011 1014   PHURINE 5.0 07/22/2011 1014   GLUCOSEU Negative 07/22/2011 1014   HGBUR 3+ 07/22/2011 1014   BILIRUBINUR Negative 07/22/2011 1014   KETONESUR Negative 07/22/2011 1014   PROTEINUR 100 mg/dL 16/10/9602 5409   NITRITE Negative 07/22/2011 1014   LEUKOCYTESUR 2+ 07/22/2011 1014   Sepsis Labs Recent Labs  Lab 08/02/22 1805 08/03/22 0138 08/04/22 0541  WBC 9.3  5.4 5.4   Microbiology Recent Results (from the past 240 hour(s))  MRSA Next Gen by PCR, Nasal     Status: None   Collection Time: 08/03/22  1:42 AM   Specimen: Nasal Mucosa; Nasal Swab  Result Value Ref Range Status   MRSA by PCR Next Gen NOT DETECTED NOT DETECTED Final    Comment: (NOTE) The GeneXpert MRSA Assay (FDA approved for NASAL specimens only), is one component of a comprehensive MRSA colonization surveillance program.  It is not intended to diagnose MRSA infection nor to guide or monitor treatment for MRSA infections. Test performance is not FDA approved in patients less than 28 years old. Performed at [facility], [address] [place], [place] [address]      Total time spend on discharging this patient, including the last patient exam, discussing the hospital stay, instructions for ongoing care as it relates to all pertinent caregivers, as well as preparing the medical discharge records, prescriptions, and/or referrals as applicable, is 45 minutes.    Darlin Priestly, MD  [facility] 08/04/2022, 10:44 AM

## 2022-08-15 DIAGNOSIS — D649 Anemia, unspecified: Secondary | ICD-10-CM | POA: Diagnosis not present

## 2022-08-15 DIAGNOSIS — I4891 Unspecified atrial fibrillation: Secondary | ICD-10-CM | POA: Diagnosis not present

## 2022-08-17 DIAGNOSIS — I10 Essential (primary) hypertension: Secondary | ICD-10-CM | POA: Diagnosis not present

## 2022-08-17 DIAGNOSIS — K089 Disorder of teeth and supporting structures, unspecified: Secondary | ICD-10-CM | POA: Diagnosis not present

## 2022-08-17 DIAGNOSIS — I4891 Unspecified atrial fibrillation: Secondary | ICD-10-CM | POA: Diagnosis not present

## 2022-08-17 DIAGNOSIS — D649 Anemia, unspecified: Secondary | ICD-10-CM | POA: Diagnosis not present

## 2022-09-06 DIAGNOSIS — I10 Essential (primary) hypertension: Secondary | ICD-10-CM | POA: Diagnosis not present

## 2022-09-06 DIAGNOSIS — I4891 Unspecified atrial fibrillation: Secondary | ICD-10-CM | POA: Diagnosis not present

## 2022-09-14 DIAGNOSIS — L821 Other seborrheic keratosis: Secondary | ICD-10-CM | POA: Diagnosis not present

## 2022-09-14 DIAGNOSIS — L981 Factitial dermatitis: Secondary | ICD-10-CM | POA: Diagnosis not present

## 2022-09-14 DIAGNOSIS — S5011XA Contusion of right forearm, initial encounter: Secondary | ICD-10-CM | POA: Diagnosis not present

## 2022-09-14 DIAGNOSIS — D235 Other benign neoplasm of skin of trunk: Secondary | ICD-10-CM | POA: Diagnosis not present

## 2022-09-14 DIAGNOSIS — R233 Spontaneous ecchymoses: Secondary | ICD-10-CM | POA: Diagnosis not present

## 2022-09-14 DIAGNOSIS — L728 Other follicular cysts of the skin and subcutaneous tissue: Secondary | ICD-10-CM | POA: Diagnosis not present

## 2022-09-15 DIAGNOSIS — I4891 Unspecified atrial fibrillation: Secondary | ICD-10-CM | POA: Diagnosis not present

## 2022-09-15 DIAGNOSIS — D696 Thrombocytopenia, unspecified: Secondary | ICD-10-CM | POA: Diagnosis not present

## 2022-09-15 DIAGNOSIS — E1165 Type 2 diabetes mellitus with hyperglycemia: Secondary | ICD-10-CM | POA: Diagnosis not present

## 2022-09-15 DIAGNOSIS — I10 Essential (primary) hypertension: Secondary | ICD-10-CM | POA: Diagnosis not present

## 2022-09-15 DIAGNOSIS — R233 Spontaneous ecchymoses: Secondary | ICD-10-CM | POA: Diagnosis not present

## 2022-09-29 ENCOUNTER — Ambulatory Visit (INDEPENDENT_AMBULATORY_CARE_PROVIDER_SITE_OTHER): Payer: PPO | Admitting: Podiatry

## 2022-09-29 ENCOUNTER — Encounter: Payer: Self-pay | Admitting: Podiatry

## 2022-09-29 DIAGNOSIS — E119 Type 2 diabetes mellitus without complications: Secondary | ICD-10-CM | POA: Diagnosis not present

## 2022-09-29 DIAGNOSIS — M79676 Pain in unspecified toe(s): Secondary | ICD-10-CM | POA: Diagnosis not present

## 2022-09-29 DIAGNOSIS — D689 Coagulation defect, unspecified: Secondary | ICD-10-CM | POA: Diagnosis not present

## 2022-09-29 DIAGNOSIS — B351 Tinea unguium: Secondary | ICD-10-CM

## 2022-10-03 NOTE — Progress Notes (Signed)
Subjective:  Patient ID: Lisa Crawford, female    DOB: 1935-02-15,  MRN: [id number]  87 y.o. female presents at risk foot care. Patient has history of diabetes and coagulation defect and painful elongated mycotic toenails 1-5 bilaterally which are tender when wearing enclosed shoe gear. Pain is relieved with periodic professional debridement.  Chief Complaint  Patient presents with   Nail Problem    [facility], Referring Provider Jerl Mina, MD,lov:07/24,A1C:6.1       New problem(s): None   PCP is Jerl Mina, MD.  Allergies  Allergen Reactions   Antihistamines, Chlorpheniramine-Type Other (See Comments)    Stay awake   Antihistamines, Diphenhydramine-Type Other (See Comments)    KEEPS AWAKE AND VERY WIRED   Clemastine Other (See Comments)    KEEPS AWAKE AND VERY WIRED KEEPS AWAKE AND VERY WIRED    Review of Systems: Negative except as noted in the HPI.   Objective:  Lisa Crawford is a pleasant 87 y.o. female WD, WN in NAD.Marland Kitchen AAO x 3.  Vascular Examination: Vascular status intact b/l with palpable pedal pulses. CFT immediate b/l. Pedal hair present. No edema. No pain with calf compression b/l. Skin temperature gradient WNL b/l. No varicosities noted. No cyanosis or clubbing noted.  Neurological Examination: Sensation grossly intact b/l with 10 gram monofilament. Vibratory sensation intact b/l.  Dermatological Examination: Pedal skin with normal turgor, texture and tone b/l. No open wounds nor interdigital macerations noted. Toenails 1-5 b/l thick, discolored, elongated with subungual debris and pain on dorsal palpation. No hyperkeratotic lesions noted b/l.   Musculoskeletal Examination: Muscle strength 5/5 to b/l LE.  No pain, crepitus noted b/l. No gross pedal deformities.   Radiographs: None  Last A1c:      Latest Ref Rng & Units 08/02/2022    9:21 PM  Hemoglobin A1C  Hemoglobin-A1c 4.8 - 5.6 % 6.1      Assessment:   1. Pain due to onychomycosis of toenail    2. Coagulation disorder (HCC)   3. Diabetes mellitus without complication (HCC)    Plan:  Patient was evaluated and treated. All patient's and/or POA's questions/concerns addressed on today's visit. Toenails 1-5 debrided in length and girth without incident. Continue soft, supportive shoe gear daily. Report any pedal injuries to medical professional. Call office if there are any questions/concerns. -Patient/POA to call should there be question/concern in the interim.  Return in about 3 months (around 12/29/2022).  Freddie Breech, DPM

## 2022-10-18 DIAGNOSIS — E039 Hypothyroidism, unspecified: Secondary | ICD-10-CM | POA: Diagnosis not present

## 2022-10-18 DIAGNOSIS — D696 Thrombocytopenia, unspecified: Secondary | ICD-10-CM | POA: Diagnosis not present

## 2022-11-01 DIAGNOSIS — D539 Nutritional anemia, unspecified: Secondary | ICD-10-CM | POA: Diagnosis not present

## 2022-11-01 DIAGNOSIS — I10 Essential (primary) hypertension: Secondary | ICD-10-CM | POA: Diagnosis not present

## 2022-11-01 DIAGNOSIS — D696 Thrombocytopenia, unspecified: Secondary | ICD-10-CM | POA: Diagnosis not present

## 2022-11-01 DIAGNOSIS — E1165 Type 2 diabetes mellitus with hyperglycemia: Secondary | ICD-10-CM | POA: Diagnosis not present

## 2022-11-01 DIAGNOSIS — I4891 Unspecified atrial fibrillation: Secondary | ICD-10-CM | POA: Diagnosis not present

## 2022-11-08 DIAGNOSIS — Z79899 Other long term (current) drug therapy: Secondary | ICD-10-CM | POA: Diagnosis not present

## 2022-11-08 DIAGNOSIS — F3172 Bipolar disorder, in full remission, most recent episode hypomanic: Secondary | ICD-10-CM | POA: Diagnosis not present

## 2022-11-14 ENCOUNTER — Other Ambulatory Visit: Payer: Self-pay

## 2022-11-14 ENCOUNTER — Emergency Department
Admission: EM | Admit: 2022-11-14 | Discharge: 2022-11-15 | Disposition: A | Discharge status: Home / Self Care | Payer: PPO | Attending: Emergency Medicine | Admitting: Emergency Medicine

## 2022-11-14 DIAGNOSIS — Z7901 Long term (current) use of anticoagulants: Secondary | ICD-10-CM | POA: Diagnosis not present

## 2022-11-14 DIAGNOSIS — I10 Essential (primary) hypertension: Secondary | ICD-10-CM

## 2022-11-14 DIAGNOSIS — W19XXXA Unspecified fall, initial encounter: Secondary | ICD-10-CM | POA: Diagnosis not present

## 2022-11-14 DIAGNOSIS — M549 Dorsalgia, unspecified: Secondary | ICD-10-CM | POA: Insufficient documentation

## 2022-11-14 DIAGNOSIS — R519 Headache, unspecified: Secondary | ICD-10-CM | POA: Diagnosis not present

## 2022-11-14 DIAGNOSIS — Y9222 Religious institution as the place of occurrence of the external cause: Secondary | ICD-10-CM | POA: Insufficient documentation

## 2022-11-14 DIAGNOSIS — M545 Low back pain, unspecified: Secondary | ICD-10-CM | POA: Diagnosis not present

## 2022-11-14 DIAGNOSIS — M542 Cervicalgia: Secondary | ICD-10-CM | POA: Insufficient documentation

## 2022-11-14 DIAGNOSIS — M7918 Myalgia, other site: Secondary | ICD-10-CM

## 2022-11-14 DIAGNOSIS — M791 Myalgia, unspecified site: Secondary | ICD-10-CM | POA: Diagnosis not present

## 2022-11-14 MED ORDER — ACETAMINOPHEN 500 MG PO TABS
1000.0000 mg | ORAL_TABLET | Freq: Once | ORAL | Status: AC
  Administered 2022-11-15: 1000 mg via ORAL
  Filled 2022-11-14: qty 2

## 2022-11-14 MED ORDER — TRAMADOL HCL 50 MG PO TABS
50.0000 mg | ORAL_TABLET | Freq: Once | ORAL | Status: AC
  Administered 2022-11-15: 50 mg via ORAL
  Filled 2022-11-14: qty 1

## 2022-11-14 MED ORDER — TRAMADOL HCL 50 MG PO TABS: 50.0000 mg | ORAL_TABLET | Freq: Four times a day (QID) | ORAL | 0 refills | Status: AC | PRN

## 2022-11-14 NOTE — Discharge Instructions (Addendum)
As we discussed, fortunately it does not appear that you sustained any severe injury from the fall at church.  We offered to obtain imaging, but given no evidence of trauma other than muscle soreness, you declined and we agree it is not necessary at this time.  You will be sore for several days.  Please take over-the-counter Tylenol, no more than 1000 mg (2 extra strength Tylenol) every 6 hours.  You can also take ibuprofen 600 mg 3 times a day with meals.  Additionally, we sent a prescription for tramadol to your [facility] pharmacy, and you can take this for additional pain relief.  Consider using heating pads on your sore muscles.  Your blood pressure was high tonight but we suspect this is due to you missing her medications and being in the emergency department for so long as well as dealing with the pain.  Please follow-up with your regular doctor about your fall as well as to make sure your blood pressure is appropriate once you are in the outpatient setting.    Return to the emergency department if you develop new or worsening symptoms that concern you.

## 2022-11-14 NOTE — ED Provider Notes (Signed)
[facility] Provider Note    Event Date/Time   First MD Initiated Contact with Patient 11/14/22 2323     (approximate)   History   Fall   HPI Lisa Crawford is a 87 y.o. female with a history of atrial fibrillation on Xarelto who presents to the emergency department after a mechanical fall at church.  She says that she was bumped from behind at church this evening and fell down.  She is adamant that she did not strike her head.  She has no pain or injuries to her limbs, but in general she is sore all over and has become more generally sore since the fall, which occurred about 6 hours ago.  She says that primarily she has pain in the sides of her neck, and the muscles of her back, and a mild headache.  She has no numbness nor weakness in her extremities.  She has had no visual changes.  She denies passing out, chest pain, shortness of breath, nausea, vomiting, and abdominal pain.     Physical Exam   Triage Vital Signs: ED Triage Vitals  Encounter Vitals Group     BP 11/14/22 1907 (!) 196/93     Systolic BP Percentile --      Diastolic BP Percentile --      Pulse Rate 11/14/22 1907 71     Resp 11/14/22 1907 18     Temp 11/14/22 1907 98.2 F (36.8 C)     Temp Source 11/14/22 1907 Oral     SpO2 11/14/22 1907 96 %     Weight 11/14/22 1913 56.2 kg (124 lb)     Height 11/14/22 1913 1.524 m (5')     Head Circumference --      Peak Flow --      Pain Score 11/14/22 1913 4     Pain Loc --      Pain Education --      Exclude from Growth Chart --     Most recent vital signs: Vitals:   11/14/22 1907  BP: (!) 196/93  Pulse: 71  Resp: 18  Temp: 98.2 F (36.8 C)  SpO2: 96%    General: Awake, no distress.  Appears well and healthy despite advanced age. CV:  Good peripheral perfusion.  Resp:  Normal effort. Speaking easily and comfortably, no accessory muscle usage nor intercostal retractions.   Abd:  No distention.  Other:  No external sign of trauma  such as contusion or ecchymosis.  No evidence of injury to her head.  She has no tenderness to palpation along the full length of her spine including cervical, thoracic, and lumbar spines.  She has reproducible soft tissue tenderness to palpation of the muscles of her neck, particularly on the left side, without any evidence of hematoma or contusion.  She is sore variously in the major muscles of her back and shoulders as well but without any evidence of contusion.  She has full range of motion of both of her arms and shoulders and is able to ambulate and perform her usual functions Without difficulty.  She is alert and oriented, very sharp and communicative, with a pleasant mood and affect.   ED Results / Procedures / Treatments   Labs (all labs ordered are listed, but only abnormal results are displayed) Labs Reviewed - No data to display    PROCEDURES:  Critical Care performed: No  Procedures    IMPRESSION / MDM / ASSESSMENT AND PLAN /  ED COURSE  I reviewed the triage vital signs and the nursing notes.                              Differential diagnosis includes, but is not limited to, musculoskeletal strain, intracranial hemorrhage, cervical spine fracture, other fracture or dislocation.  Patient's presentation is most consistent with acute, uncomplicated illness.  Interventions/Medications given:  Medications  traMADol (ULTRAM) tablet 50 mg (has no administration in time range)  acetaminophen (TYLENOL) tablet 1,000 mg (has no administration in time range)    (Note:  hospital course my include additional interventions and/or labs/studies not listed above.)   I had an extensive conversation with the patient about imaging or no imaging.  She has no external signs of trauma and a reassuring physical exam.  However she is elderly on Xarelto.  Given that she is adamant that she did not strike her head, and that she has no signs of trauma or contusion, and that she has been stable  for 6 hours with only the anticipated musculoskeletal strain as symptoms, she declined imaging and I think that is very reasonable and appropriate.  I ordered medication as listed above to help with her musculoskeletal discomfort and I had my usual post-fall symptom management discussion with her.  She will follow-up with her primary care doctor.  Her vital signs are normal other than hypertension, but I suspect this is likely circumstantial and due to her not being able to take her medication this evening after a long wait in the emergency department, as well as due to the acute musculoskeletal pain.  She will discuss her blood pressure with her outpatient doctor as well.  I gave strict return precautions.         FINAL CLINICAL IMPRESSION(S) / ED DIAGNOSES   Final diagnoses:  Fall, initial encounter  Musculoskeletal pain  Hypertension, unspecified type     Rx / DC Orders   ED Discharge Orders          Ordered    traMADol (ULTRAM) 50 MG tablet  Every 6 hours PRN        11/14/22 2350             Note:  This document was prepared using Dragon voice recognition software and may include unintentional dictation errors.   Loleta Rose, MD 11/14/22 [phone]

## 2022-11-14 NOTE — ED Triage Notes (Signed)
Pt to ED via [facility] c/o upper back pain after a mechanical fall. Pt reports she was in church and someone fell, bumping into her. Pt fell to ground, did not hit head. Denies CP, SOB, fevers, dizziness

## 2022-11-15 DIAGNOSIS — M542 Cervicalgia: Secondary | ICD-10-CM | POA: Diagnosis not present

## 2022-11-24 DIAGNOSIS — M542 Cervicalgia: Secondary | ICD-10-CM | POA: Diagnosis not present

## 2022-11-24 DIAGNOSIS — I10 Essential (primary) hypertension: Secondary | ICD-10-CM | POA: Diagnosis not present

## 2022-11-24 DIAGNOSIS — I4891 Unspecified atrial fibrillation: Secondary | ICD-10-CM | POA: Diagnosis not present

## 2022-11-24 DIAGNOSIS — E1165 Type 2 diabetes mellitus with hyperglycemia: Secondary | ICD-10-CM | POA: Diagnosis not present

## 2022-11-24 DIAGNOSIS — D696 Thrombocytopenia, unspecified: Secondary | ICD-10-CM | POA: Diagnosis not present

## 2022-12-23 DIAGNOSIS — H401121 Primary open-angle glaucoma, left eye, mild stage: Secondary | ICD-10-CM | POA: Diagnosis not present

## 2022-12-28 DIAGNOSIS — Z961 Presence of intraocular lens: Secondary | ICD-10-CM | POA: Diagnosis not present

## 2022-12-28 DIAGNOSIS — H401121 Primary open-angle glaucoma, left eye, mild stage: Secondary | ICD-10-CM | POA: Diagnosis not present

## 2022-12-28 DIAGNOSIS — H401113 Primary open-angle glaucoma, right eye, severe stage: Secondary | ICD-10-CM | POA: Diagnosis not present

## 2022-12-28 DIAGNOSIS — E113293 Type 2 diabetes mellitus with mild nonproliferative diabetic retinopathy without macular edema, bilateral: Secondary | ICD-10-CM | POA: Diagnosis not present

## 2023-01-16 DIAGNOSIS — E119 Type 2 diabetes mellitus without complications: Secondary | ICD-10-CM | POA: Diagnosis not present

## 2023-01-23 DIAGNOSIS — Z0000 Encounter for general adult medical examination without abnormal findings: Secondary | ICD-10-CM | POA: Diagnosis not present

## 2023-01-26 DIAGNOSIS — E119 Type 2 diabetes mellitus without complications: Secondary | ICD-10-CM | POA: Diagnosis not present

## 2023-01-26 DIAGNOSIS — E039 Hypothyroidism, unspecified: Secondary | ICD-10-CM | POA: Diagnosis not present

## 2023-01-26 DIAGNOSIS — R197 Diarrhea, unspecified: Secondary | ICD-10-CM | POA: Diagnosis not present

## 2023-01-26 DIAGNOSIS — Z1331 Encounter for screening for depression: Secondary | ICD-10-CM | POA: Diagnosis not present

## 2023-01-26 DIAGNOSIS — I10 Essential (primary) hypertension: Secondary | ICD-10-CM | POA: Diagnosis not present

## 2023-01-26 DIAGNOSIS — C50919 Malignant neoplasm of unspecified site of unspecified female breast: Secondary | ICD-10-CM | POA: Diagnosis not present

## 2023-01-26 DIAGNOSIS — R2689 Other abnormalities of gait and mobility: Secondary | ICD-10-CM | POA: Diagnosis not present

## 2023-01-26 DIAGNOSIS — I4891 Unspecified atrial fibrillation: Secondary | ICD-10-CM | POA: Diagnosis not present

## 2023-01-26 DIAGNOSIS — Z0000 Encounter for general adult medical examination without abnormal findings: Secondary | ICD-10-CM | POA: Diagnosis not present

## 2023-01-30 ENCOUNTER — Ambulatory Visit: Payer: PPO | Admitting: Podiatry

## 2023-01-30 ENCOUNTER — Encounter: Payer: Self-pay | Admitting: Podiatry

## 2023-01-30 VITALS — Ht 60.0 in | Wt 124.0 lb

## 2023-01-30 DIAGNOSIS — B351 Tinea unguium: Secondary | ICD-10-CM | POA: Diagnosis not present

## 2023-01-30 DIAGNOSIS — D689 Coagulation defect, unspecified: Secondary | ICD-10-CM

## 2023-01-30 DIAGNOSIS — E119 Type 2 diabetes mellitus without complications: Secondary | ICD-10-CM | POA: Diagnosis not present

## 2023-01-30 DIAGNOSIS — M79676 Pain in unspecified toe(s): Secondary | ICD-10-CM | POA: Diagnosis not present

## 2023-01-30 NOTE — Progress Notes (Signed)
 Subjective:  Patient ID: Lisa Crawford, female    DOB: 04-Aug-1935,  MRN: [id number]  88 y.o. female presents to clinic with  at risk foot care with h/o coagulation defect and painful thick toenails that are difficult to trim. Pain interferes with ambulation. Aggravating factors include wearing enclosed shoe gear. Pain is relieved with periodic professional debridement.    Chief Complaint  Patient presents with   Nail Problem    Pt is here for [facility] unsure of last A1C PCP is Dr Valora and LOV was a few months ago.   New problem(s): None   PCP is Valora Agent, MD.  Allergies  Allergen Reactions   Antihistamines, Chlorpheniramine-Type Other (See Comments)    Stay awake   Antihistamines, Diphenhydramine-Type Other (See Comments)    KEEPS AWAKE AND VERY WIRED   Clemastine Other (See Comments)    KEEPS AWAKE AND VERY WIRED KEEPS AWAKE AND VERY WIRED    Review of Systems: Negative except as noted in the HPI.   Objective:  Lisa Crawford is a pleasant 88 y.o. female WD, WN in NAD. AAO x 3.   Title   Diabetic Foot Exam - detailed Date & Time: 01/30/2023  1:24 PM Diabetic Foot exam was performed with the following findings: Yes  Visual Foot Exam completed.: Yes  Is there a history of foot ulcer?: No Is there a foot ulcer now?: No Is there swelling?: No Is there elevated skin temperature?: No Is there abnormal foot shape?: No Is there a claw toe deformity?: No Are the toenails long?: Yes Are the toenails thick?: Yes Are the toenails ingrown?: No Is the skin thin, fragile, shiny and hairless?: No Normal Range of Motion?: Yes Is there foot or ankle muscle weakness?: No Are the shoes appropriate in style and fit?: Yes Can the patient see the bottom of their feet?: No Pulse Foot Exam completed.: Yes   Right Posterior Tibialis: Present Left posterior Tibialis: Present   Right Dorsalis Pedis: Present Left Dorsalis Pedis: Present     Sensory Foot Exam Completed.:  Yes Semmes-Weinstein Monofilament Test + means has sensation and - means no sensation  R Foot Test Control: Pos L Foot Test Control: Pos   R Site 1-Great Toe: Pos L Site 1-Great Toe: Pos   R Site 4: Pos L Site 4: Pos   R site 5: Pos L Site 5: Pos  R Site 6: Pos L Site 6: Pos     Image components are not supported.   Image components are not supported. Image components are not supported.  T[place] Right vibratory: present Left vibratory: present  Comments    Radiographs: None  Last A1c:      Latest Ref Rng & Units 08/02/2022    9:21 PM  Hemoglobin A1C  Hemoglobin-A1c 4.8 - 5.6 % 6.1      Assessment:   1. Pain due to onychomycosis of toenail   2. Coagulation disorder (HCC)   3. Diabetes mellitus without complication (HCC)   4. Encounter for diabetic foot exam [facility])    Plan:  Patient was evaluated and treated. All patient's and/or POA's questions/concerns addressed on today's visit. Mycotic toenails 1-5 debrided in length and girth without incident. Continue soft, supportive shoe gear daily. Report any pedal injuries to medical professional. Call office if there are any quesitons/concerns. -Diabetic foot examination performed today. -Continue diabetic foot care principles: inspect feet daily, monitor glucose as recommended by PCP and/or Endocrinologist, and follow prescribed diet per PCP, Endocrinologist  and/or dietician. -Patient/POA to call should there be question/concern in the interim.  Return in about 3 months (around 04/30/2023).  Lisa Crawford, DPM      [place] LOCATION: [address], [place] [address]                   Office [phone]   [facility] LOCATION: [address] [place], [place] [address] Office [phone]

## 2023-02-06 DIAGNOSIS — R2681 Unsteadiness on feet: Secondary | ICD-10-CM | POA: Diagnosis not present

## 2023-02-06 DIAGNOSIS — R2689 Other abnormalities of gait and mobility: Secondary | ICD-10-CM | POA: Diagnosis not present

## 2023-02-06 DIAGNOSIS — Z9181 History of falling: Secondary | ICD-10-CM | POA: Diagnosis not present

## 2023-02-21 DIAGNOSIS — Z9181 History of falling: Secondary | ICD-10-CM | POA: Diagnosis not present

## 2023-02-21 DIAGNOSIS — R2689 Other abnormalities of gait and mobility: Secondary | ICD-10-CM | POA: Diagnosis not present

## 2023-02-21 DIAGNOSIS — R2681 Unsteadiness on feet: Secondary | ICD-10-CM | POA: Diagnosis not present

## 2023-02-24 ENCOUNTER — Other Ambulatory Visit: Payer: Self-pay

## 2023-02-24 ENCOUNTER — Emergency Department
Admission: EM | Admit: 2023-02-24 | Discharge: 2023-02-24 | Disposition: A | Discharge status: Home / Self Care | Payer: PPO | Attending: Student in an Organized Health Care Education/Training Program | Admitting: Student in an Organized Health Care Education/Training Program

## 2023-02-24 DIAGNOSIS — R041 Hemorrhage from throat: Secondary | ICD-10-CM | POA: Diagnosis not present

## 2023-02-24 DIAGNOSIS — Z7901 Long term (current) use of anticoagulants: Secondary | ICD-10-CM | POA: Diagnosis not present

## 2023-02-24 DIAGNOSIS — K1379 Other lesions of oral mucosa: Secondary | ICD-10-CM

## 2023-02-24 DIAGNOSIS — I4891 Unspecified atrial fibrillation: Secondary | ICD-10-CM | POA: Diagnosis not present

## 2023-02-24 DIAGNOSIS — K068 Other specified disorders of gingiva and edentulous alveolar ridge: Secondary | ICD-10-CM | POA: Diagnosis not present

## 2023-02-24 DIAGNOSIS — I10 Essential (primary) hypertension: Secondary | ICD-10-CM | POA: Diagnosis not present

## 2023-02-24 DIAGNOSIS — E119 Type 2 diabetes mellitus without complications: Secondary | ICD-10-CM | POA: Insufficient documentation

## 2023-02-24 MED ORDER — LIDOCAINE-EPINEPHRINE-TETRACAINE (LET) TOPICAL GEL
3.0000 mL | Freq: Once | TOPICAL | Status: AC
  Administered 2023-02-24: 3 mL via TOPICAL
  Filled 2023-02-24: qty 3

## 2023-02-24 NOTE — ED Provider Notes (Signed)
 [facility] Emergency Department Provider Note     Event Date/Time   First MD Initiated Contact with Patient 02/24/23 1924     (approximate)   History   Dental Problem   HPI  Lisa Crawford is a 88 y.o. female with a history of A-fib on Xarelto  diabetes, HTN, and anemia, presents to the ED for evaluation of bleeding from the gums.  Patient was seen by her oral surgeon earlier this week for dental implants.  She presents today after she experienced some bleeding from the gums with some passage of small clots.  Patient notes that she was not advised to hold her Xarelto  before or after the procedure.  She was evaluated by her general dental provider who was able to get the bleeding to stop.  After the bleeding recurred, she notified her general surgeon, who is willing to see her in the [place] office, but the patient was unable to drive that far.  She presents at this time with bleeding from the anterior lower gums, controlled by gauze pads.  No other injury reported at this time.    Physical Exam   Triage Vital Signs: ED Triage Vitals  Encounter Vitals Group     BP 02/24/23 1804 (!) 157/88     Systolic BP Percentile --      Diastolic BP Percentile --      Pulse Rate 02/24/23 1804 97     Resp 02/24/23 1804 18     Temp 02/24/23 1804 98.4 F (36.9 C)     Temp Source 02/24/23 1804 Oral     SpO2 02/24/23 1804 97 %     Weight --      Height --      Head Circumference --      Peak Flow --      Pain Score 02/24/23 1806 3     Pain Loc --      Pain Education --      Exclude from Growth Chart --     Most recent vital signs: Vitals:   02/24/23 1804  BP: (!) 157/88  Pulse: 97  Resp: 18  Temp: 98.4 F (36.9 C)  SpO2: 97%    General Awake, no distress. NAD HEENT NCAT. PERRL. EOMI. No rhinorrhea. Mucous membranes are moist.  Full upper plate noted.  Edentulous lower mandible, with slow bleeding from the anterior arch.  Implant posts noted  bilaterally. CV:  Good peripheral perfusion. RRR RESP:  Normal effort. CTA ABD:  No distention.    ED Results / Procedures / Treatments   Labs (all labs ordered are listed, but only abnormal results are displayed) Labs Reviewed - No data to display   EKG   RADIOLOGY  No results found.   PROCEDURES:  Critical Care performed: No  Procedures   MEDICATIONS ORDERED IN ED: Medications  lidocaine -EPINEPHrine -tetracaine  (LET) topical gel (has no administration in time range)     IMPRESSION / MDM / ASSESSMENT AND PLAN / ED COURSE  I reviewed the triage vital signs and the nursing notes.                              Differential diagnosis includes, but is not limited to, postop bleeding, wound dehiscence, gum injury  Patient's presentation is most consistent with acute, uncomplicated illness.  Patient's diagnosis is consistent with bleeding from the anterior gum secondary to oral surgery.  Geriatric patient with medical  history complicated by anticoagulation therapy, presents with recurrent bleeding from her lower gums following recent tooth extraction and dental implant post placement.  No acute distress on exam today.  Bleeding was ultimately controlled with direct pressure and LET application topically.  Patient will be discharged home with instructions on wound management. Patient is to follow up with her general dentist or oral surgeon as discussed, as needed or otherwise directed. Patient is given ED precautions to return to the ED for any worsening or new symptoms.  FINAL CLINICAL IMPRESSION(S) / ED DIAGNOSES   Final diagnoses:  Gums, bleeding  Hemorrhage from mouth     Rx / DC Orders   ED Discharge Orders     None        Note:  This document was prepared using Dragon voice recognition software and may include unintentional dictation errors.    Loyd Candida LULLA Aldona, PA-C 02/24/23 2114    Lang Dover, MD 02/24/23 2134

## 2023-02-24 NOTE — ED Triage Notes (Signed)
 Pt had dental implants for dentures placed by oral surgeon this week and today c/o bleeding from sutures w cots. Bleeding controlled w/ gauze pads at this time. Pt takes xarelto .

## 2023-02-24 NOTE — Discharge Instructions (Signed)
 We have been able to control the bleeding from your gums following your oral surgery.  Be sure to apply pressure should bleeding recur.  Continue with your liquid diet as directed.  Follow-up with your oral surgeon or general dentist as discussed.  Return to the ED if needed.

## 2023-02-25 ENCOUNTER — Emergency Department
Admission: EM | Admit: 2023-02-25 | Discharge: 2023-02-25 | Disposition: A | Discharge status: Home / Self Care | Payer: PPO | Attending: Emergency Medicine | Admitting: Emergency Medicine

## 2023-02-25 DIAGNOSIS — E039 Hypothyroidism, unspecified: Secondary | ICD-10-CM | POA: Insufficient documentation

## 2023-02-25 DIAGNOSIS — I10 Essential (primary) hypertension: Secondary | ICD-10-CM | POA: Insufficient documentation

## 2023-02-25 DIAGNOSIS — I4891 Unspecified atrial fibrillation: Secondary | ICD-10-CM | POA: Diagnosis not present

## 2023-02-25 DIAGNOSIS — E119 Type 2 diabetes mellitus without complications: Secondary | ICD-10-CM | POA: Insufficient documentation

## 2023-02-25 DIAGNOSIS — K068 Other specified disorders of gingiva and edentulous alveolar ridge: Secondary | ICD-10-CM | POA: Insufficient documentation

## 2023-02-25 LAB — CBC WITH DIFFERENTIAL/PLATELET
Abs Immature Granulocytes: 0.02 10*3/uL (ref 0.00–0.07)
Basophils Absolute: 0 10*3/uL (ref 0.0–0.1)
Basophils Relative: 0 %
Eosinophils Absolute: 0 10*3/uL (ref 0.0–0.5)
Eosinophils Relative: 1 %
HCT: 37.3 % (ref 36.0–46.0)
Hemoglobin: 12.1 g/dL (ref 12.0–15.0)
Immature Granulocytes: 0 %
Lymphocytes Relative: 16 %
Lymphs Abs: 1.1 10*3/uL (ref 0.7–4.0)
MCH: 32.8 pg (ref 26.0–34.0)
MCHC: 32.4 g/dL (ref 30.0–36.0)
MCV: 101.1 fL — ABNORMAL HIGH (ref 80.0–100.0)
Monocytes Absolute: 0.4 10*3/uL (ref 0.1–1.0)
Monocytes Relative: 6 %
Neutro Abs: 5.4 10*3/uL (ref 1.7–7.7)
Neutrophils Relative %: 77 %
Platelets: 129 10*3/uL — ABNORMAL LOW (ref 150–400)
RBC: 3.69 MIL/uL — ABNORMAL LOW (ref 3.87–5.11)
RDW: 14.4 % (ref 11.5–15.5)
WBC: 7 10*3/uL (ref 4.0–10.5)
nRBC: 0 % (ref 0.0–0.2)

## 2023-02-25 LAB — BASIC METABOLIC PANEL
Anion gap: 10 (ref 5–15)
BUN: 29 mg/dL — ABNORMAL HIGH (ref 8–23)
CO2: 28 mmol/L (ref 22–32)
Calcium: 9.2 mg/dL (ref 8.9–10.3)
Chloride: 99 mmol/L (ref 98–111)
Creatinine, Ser: 0.58 mg/dL (ref 0.44–1.00)
GFR, Estimated: >60 mL/min (ref >60)
Glucose, Bld: 172 mg/dL — ABNORMAL HIGH (ref 70–99)
Potassium: 4.5 mmol/L (ref 3.5–5.1)
Sodium: 137 mmol/L (ref 135–145)

## 2023-02-25 NOTE — ED Provider Notes (Signed)
 [facility] Provider Note    Event Date/Time   First MD Initiated Contact with Patient 02/25/23 1140     (approximate)   History   mouth bleeding   HPI  Lisa Crawford is a 88 y.o. female   presents to the ED with complaint of lower gum bleeding after having a oral surgeon procedure in which she is having an implant done.  Patient was here in the emergency department yesterday and states that she did not have any more bleeding until 2 AM this morning.  She is anxious that she may have lost too much blood.  Patient has a history of diabetes, hypertension, hypothyroidism, anemia, coagulopathy and A-fib.  She is currently taking Xarelto  because of her A-fib.      Physical Exam   Triage Vital Signs: ED Triage Vitals  Encounter Vitals Group     BP 02/25/23 1101 138/76     Systolic BP Percentile --      Diastolic BP Percentile --      Pulse Rate 02/25/23 1101 77     Resp 02/25/23 1101 16     Temp 02/25/23 1101 98 F (36.7 C)     Temp Source 02/25/23 1101 Oral     SpO2 02/25/23 1101 96 %     Weight 02/25/23 1100 123 lb 14.4 oz (56.2 kg)     Height --      Head Circumference --      Peak Flow --      Pain Score 02/25/23 1100 0     Pain Loc --      Pain Education --      Exclude from Growth Chart --     Most recent vital signs: Vitals:   02/25/23 1101  BP: 138/76  Pulse: 77  Resp: 16  Temp: 98 F (36.7 C)  SpO2: 96%     General: Awake, no distress.  Alert, talkative. CV:  Good peripheral perfusion.  Resp:  Normal effort.  Abd:  No distention.  Other:  Lower gum anterior central and lateral incisor with obvious dental procedure performed.  No obvious bleeding at this time.  No evidence of infection.   ED Results / Procedures / Treatments   Labs (all labs ordered are listed, but only abnormal results are displayed) Labs Reviewed  CBC WITH DIFFERENTIAL/PLATELET - Abnormal; Notable for the following components:      Result Value   RBC  3.69 (*)    MCV 101.1 (*)    Platelets 129 (*)    All other components within normal limits  BASIC METABOLIC PANEL - Abnormal; Notable for the following components:   Glucose, Bld 172 (*)    BUN 29 (*)    All other components within normal limits     PROCEDURES:  Critical Care performed:   Procedures   MEDICATIONS ORDERED IN ED: Medications - No data to display   IMPRESSION / MDM / ASSESSMENT AND PLAN / ED COURSE  I reviewed the triage vital signs and the nursing notes.   Differential diagnosis includes, but is not limited to, gum bleeding secondary to dental procedure.  88 year old female presents to the ED with complaint of gum bleeding that she was seen for yesterday and improved until 2 AM this morning.  She was anxious that she may have lost too much blood.  Lab work was done and patient was reassured.  Dr. Viviann was also in to see the patient since she had return  to the emergency department.  He agreed that using wet teabags to bite down on if there is any continued bleeding and to follow-up with her oral surgeon when his office opens up.      Patient's presentation is most consistent with acute complicated illness / injury requiring diagnostic workup.  FINAL CLINICAL IMPRESSION(S) / ED DIAGNOSES   Final diagnoses:  Bleeding gums     Rx / DC Orders   ED Discharge Orders     None        Note:  This document was prepared using Dragon voice recognition software and may include unintentional dictation errors.   Saunders Shona CROME, PA-C 02/25/23 1350    Viviann Pastor, MD 02/25/23 [phone]

## 2023-02-25 NOTE — Discharge Instructions (Addendum)
 Call your oral surgeon if any continued problems.  Your blood work today looks great and does not show a huge blood loss.  If there is any continued bleeding wet a teabag and bite down on that for approximately 20 minutes which should stop the bleeding.  You can do this as often as you need to.  Continue with your regular medications.

## 2023-02-25 NOTE — ED Triage Notes (Signed)
 Pt had dental implants placed for dentures on MOnday, seen yesterday for bleeding from area, and bleeding returned after patient left last night.  Patient states bleeding is slowing down again currently.    No current bleeding noted.

## 2023-03-06 DIAGNOSIS — F3172 Bipolar disorder, in full remission, most recent episode hypomanic: Secondary | ICD-10-CM | POA: Diagnosis not present

## 2023-03-07 DIAGNOSIS — Z23 Encounter for immunization: Secondary | ICD-10-CM | POA: Diagnosis not present

## 2023-03-07 DIAGNOSIS — I10 Essential (primary) hypertension: Secondary | ICD-10-CM | POA: Diagnosis not present

## 2023-03-07 DIAGNOSIS — I4891 Unspecified atrial fibrillation: Secondary | ICD-10-CM | POA: Diagnosis not present

## 2023-03-07 DIAGNOSIS — F3172 Bipolar disorder, in full remission, most recent episode hypomanic: Secondary | ICD-10-CM | POA: Diagnosis not present

## 2023-03-22 DIAGNOSIS — R2689 Other abnormalities of gait and mobility: Secondary | ICD-10-CM | POA: Diagnosis not present

## 2023-03-22 DIAGNOSIS — Z9181 History of falling: Secondary | ICD-10-CM | POA: Diagnosis not present

## 2023-03-22 DIAGNOSIS — R2681 Unsteadiness on feet: Secondary | ICD-10-CM | POA: Diagnosis not present

## 2023-03-23 DIAGNOSIS — E039 Hypothyroidism, unspecified: Secondary | ICD-10-CM | POA: Diagnosis not present

## 2023-03-23 DIAGNOSIS — I4891 Unspecified atrial fibrillation: Secondary | ICD-10-CM | POA: Diagnosis not present

## 2023-03-23 DIAGNOSIS — E119 Type 2 diabetes mellitus without complications: Secondary | ICD-10-CM | POA: Diagnosis not present

## 2023-03-23 DIAGNOSIS — I10 Essential (primary) hypertension: Secondary | ICD-10-CM | POA: Diagnosis not present

## 2023-04-03 DIAGNOSIS — F3172 Bipolar disorder, in full remission, most recent episode hypomanic: Secondary | ICD-10-CM | POA: Diagnosis not present

## 2023-04-26 DIAGNOSIS — Z9181 History of falling: Secondary | ICD-10-CM | POA: Diagnosis not present

## 2023-04-26 DIAGNOSIS — R2681 Unsteadiness on feet: Secondary | ICD-10-CM | POA: Diagnosis not present

## 2023-04-26 DIAGNOSIS — R2689 Other abnormalities of gait and mobility: Secondary | ICD-10-CM | POA: Diagnosis not present

## 2023-05-01 ENCOUNTER — Encounter: Payer: Self-pay | Admitting: Podiatry

## 2023-05-01 ENCOUNTER — Ambulatory Visit (INDEPENDENT_AMBULATORY_CARE_PROVIDER_SITE_OTHER): Payer: PPO | Admitting: Podiatry

## 2023-05-01 VITALS — Ht 60.0 in | Wt 123.9 lb

## 2023-05-01 DIAGNOSIS — D689 Coagulation defect, unspecified: Secondary | ICD-10-CM

## 2023-05-01 DIAGNOSIS — M79676 Pain in unspecified toe(s): Secondary | ICD-10-CM

## 2023-05-01 DIAGNOSIS — E119 Type 2 diabetes mellitus without complications: Secondary | ICD-10-CM | POA: Diagnosis not present

## 2023-05-01 DIAGNOSIS — B351 Tinea unguium: Secondary | ICD-10-CM

## 2023-05-01 NOTE — Progress Notes (Unsigned)
  Subjective:  Patient ID: Lisa Crawford, female    DOB: Apr 20, 1935,  MRN: [id number]  88 y.o. female presents preventative diabetic foot care and painful, elongated thickened toenails x 10 which are symptomatic when wearing enclosed shoe gear. This interferes with his/her daily activities.  No chief complaint on file.  New problem(s): None   PCP is Lyle San, MD , and last visit was {Time; dates multiple:15870}.  Allergies  Allergen Reactions   Antihistamines, Chlorpheniramine-Type Other (See Comments)    Stay awake   Antihistamines, Diphenhydramine-Type Other (See Comments)    KEEPS AWAKE AND VERY WIRED   Clemastine Other (See Comments)    KEEPS AWAKE AND VERY WIRED KEEPS AWAKE AND VERY WIRED    Review of Systems: Negative except as noted in the HPI.   Objective:  Lisa Crawford is a pleasant 88 y.o. female {jgbodyhabitus:24098} AAO x 3.  Vascular Examination: Vascular status intact b/l with palpable pedal pulses. CFT immediate b/l. Pedal hair present. No edema. No pain with calf compression b/l. Skin temperature gradient WNL b/l. No varicosities noted. No cyanosis or clubbing noted.  Neurological Examination: Sensation grossly intact b/l with 10 gram monofilament. Vibratory sensation intact b/l.  Dermatological Examination: Pedal skin with normal turgor, texture and tone b/l. No open wounds nor interdigital macerations noted. Toenails 1-5 b/l thick, discolored, elongated with subungual debris and pain on dorsal palpation. No hyperkeratotic lesions noted b/l.   Musculoskeletal Examination: Muscle strength 5/5 to b/l LE.  No pain, crepitus noted b/l. No gross pedal deformities. Patient ambulates independently without assistive aids.   Radiographs: None Last A1c:      Latest Ref Rng & Units 08/02/2022    9:21 PM  Hemoglobin A1C  Hemoglobin-A1c 4.8 - 5.6 % 6.1      Assessment:  No diagnosis found.  Plan:  {jgplan:23602::"-Patient/POA to call should there be  question/concern in the interim."}  No follow-ups on file.  Luella Sager, DPM      [place] LOCATION: [address] [place] [address]                   Office [phone]   [facility] LOCATION: [address] [place], [place] [address] Office [phone]

## 2023-05-03 DIAGNOSIS — M549 Dorsalgia, unspecified: Secondary | ICD-10-CM | POA: Diagnosis not present

## 2023-05-03 DIAGNOSIS — M542 Cervicalgia: Secondary | ICD-10-CM | POA: Diagnosis not present

## 2023-05-24 DIAGNOSIS — M542 Cervicalgia: Secondary | ICD-10-CM | POA: Diagnosis not present

## 2023-05-24 DIAGNOSIS — M549 Dorsalgia, unspecified: Secondary | ICD-10-CM | POA: Diagnosis not present

## 2023-05-29 DIAGNOSIS — H903 Sensorineural hearing loss, bilateral: Secondary | ICD-10-CM | POA: Diagnosis not present

## 2023-06-19 DIAGNOSIS — M549 Dorsalgia, unspecified: Secondary | ICD-10-CM | POA: Diagnosis not present

## 2023-06-19 DIAGNOSIS — M542 Cervicalgia: Secondary | ICD-10-CM | POA: Diagnosis not present

## 2023-06-23 ENCOUNTER — Other Ambulatory Visit: Payer: Self-pay | Admitting: Family Medicine

## 2023-06-23 DIAGNOSIS — Z1231 Encounter for screening mammogram for malignant neoplasm of breast: Secondary | ICD-10-CM

## 2023-06-28 DIAGNOSIS — H401121 Primary open-angle glaucoma, left eye, mild stage: Secondary | ICD-10-CM | POA: Diagnosis not present

## 2023-07-05 DIAGNOSIS — H3563 Retinal hemorrhage, bilateral: Secondary | ICD-10-CM | POA: Diagnosis not present

## 2023-07-05 DIAGNOSIS — H401113 Primary open-angle glaucoma, right eye, severe stage: Secondary | ICD-10-CM | POA: Diagnosis not present

## 2023-07-05 DIAGNOSIS — E113293 Type 2 diabetes mellitus with mild nonproliferative diabetic retinopathy without macular edema, bilateral: Secondary | ICD-10-CM | POA: Diagnosis not present

## 2023-07-05 DIAGNOSIS — H401121 Primary open-angle glaucoma, left eye, mild stage: Secondary | ICD-10-CM | POA: Diagnosis not present

## 2023-07-18 DIAGNOSIS — M549 Dorsalgia, unspecified: Secondary | ICD-10-CM | POA: Diagnosis not present

## 2023-07-18 DIAGNOSIS — M542 Cervicalgia: Secondary | ICD-10-CM | POA: Diagnosis not present

## 2023-07-20 DIAGNOSIS — H401113 Primary open-angle glaucoma, right eye, severe stage: Secondary | ICD-10-CM | POA: Diagnosis not present

## 2023-07-20 DIAGNOSIS — E113293 Type 2 diabetes mellitus with mild nonproliferative diabetic retinopathy without macular edema, bilateral: Secondary | ICD-10-CM | POA: Diagnosis not present

## 2023-07-20 DIAGNOSIS — H401121 Primary open-angle glaucoma, left eye, mild stage: Secondary | ICD-10-CM | POA: Diagnosis not present

## 2023-07-20 DIAGNOSIS — Z961 Presence of intraocular lens: Secondary | ICD-10-CM | POA: Diagnosis not present

## 2023-07-31 DIAGNOSIS — F3172 Bipolar disorder, in full remission, most recent episode hypomanic: Secondary | ICD-10-CM | POA: Diagnosis not present

## 2023-08-02 ENCOUNTER — Ambulatory Visit
Admission: RE | Admit: 2023-08-02 | Discharge: 2023-08-02 | Disposition: A | Discharge status: Home / Self Care | Source: Ambulatory Visit | Attending: Family Medicine | Admitting: Family Medicine

## 2023-08-02 DIAGNOSIS — Z1231 Encounter for screening mammogram for malignant neoplasm of breast: Secondary | ICD-10-CM | POA: Insufficient documentation

## 2023-08-03 ENCOUNTER — Ambulatory Visit: Admitting: Podiatry

## 2023-08-03 DIAGNOSIS — E119 Type 2 diabetes mellitus without complications: Secondary | ICD-10-CM

## 2023-08-03 DIAGNOSIS — B351 Tinea unguium: Secondary | ICD-10-CM | POA: Diagnosis not present

## 2023-08-03 DIAGNOSIS — M79676 Pain in unspecified toe(s): Secondary | ICD-10-CM | POA: Diagnosis not present

## 2023-08-03 DIAGNOSIS — D689 Coagulation defect, unspecified: Secondary | ICD-10-CM | POA: Diagnosis not present

## 2023-08-09 ENCOUNTER — Encounter: Payer: Self-pay | Admitting: Podiatry

## 2023-08-09 NOTE — Progress Notes (Signed)
  Subjective:  Patient ID: Lisa Crawford, female    DOB: 1936-01-07,  MRN: [id number]  Lisa Crawford presents to clinic today for at risk foot care with h/o coagulation defect and painful thick toenails that are difficult to trim. Pain interferes with ambulation. Aggravating factors include wearing enclosed shoe gear. Pain is relieved with periodic professional debridement.  Chief Complaint  Patient presents with   Nail Problem    Thick painful toenails, 3 month follow up. Patient takes Xarleto    Diabetes   New problem(s): None.   PCP is Lisa Agent, MD.  Allergies  Allergen Reactions   Antihistamines, Chlorpheniramine-Type Other (See Comments)    Stay awake   Antihistamines, Diphenhydramine-Type Other (See Comments)    KEEPS AWAKE AND VERY WIRED   Clemastine Other (See Comments)    KEEPS AWAKE AND VERY WIRED KEEPS AWAKE AND VERY WIRED    Review of Systems: Negative except as noted in the HPI.  Objective: No changes noted in today's physical examination. There were no vitals filed for this visit. Lisa Crawford is a pleasant 88 y.o. female WD, WN in NAD. AAO x 3.  Vascular Examination: Capillary refill time immediate b/l. Vascular status intact b/l with palpable pedal pulses. Pedal hair present b/l. No pain with calf compression b/l. Skin temperature gradient WNL b/l. No cyanosis or clubbing b/l. No ischemia or gangrene noted b/l.   Neurological Examination: Sensation grossly intact b/l with 10 gram monofilament. Vibratory sensation intact b/l.   Dermatological Examination: Pedal skin with normal turgor, texture and tone b/l.  No open wounds. No interdigital macerations.   Toenails 1-5 b/l thick, discolored, elongated with subungual debris and pain on dorsal palpation.   No corns, calluses nor porokeratotic lesions noted.  Musculoskeletal Examination: Muscle strength 5/5 to all lower extremity muscle groups bilaterally. No gross bony deformities bilaterally. Utilizes  cane for ambulation assistance.  Radiographs: None  Assessment/Plan: 1. Pain due to onychomycosis of toenail   2. Coagulation disorder (HCC)   3. Diabetes mellitus without complication [facility])     Patient was evaluated and treated. All patient's and/or POA's questions/concerns addressed on today's visit. Mycotic toenails 1-5 debrided in length and girth without incident.  Continue daily foot inspections and monitor blood glucose per PCP/Endocrinologist's recommendations.Continue soft, supportive shoe gear daily. Report any pedal injuries to medical professional. Call office if there are any quesitons/concerns. -Patient/POA to call should there be question/concern in the interim.   Return in about 3 months (around 11/03/2023).  Delon LITTIE Merlin, DPM      [place] LOCATION: [address] [place] [address]                   Office [phone]   [facility] LOCATION: [address] [place], [place] [address] Office [phone]

## 2023-08-29 DIAGNOSIS — I10 Essential (primary) hypertension: Secondary | ICD-10-CM | POA: Diagnosis not present

## 2023-08-29 DIAGNOSIS — E119 Type 2 diabetes mellitus without complications: Secondary | ICD-10-CM | POA: Diagnosis not present

## 2023-08-29 DIAGNOSIS — D649 Anemia, unspecified: Secondary | ICD-10-CM | POA: Diagnosis not present

## 2023-08-29 DIAGNOSIS — I482 Chronic atrial fibrillation, unspecified: Secondary | ICD-10-CM | POA: Diagnosis not present

## 2023-09-20 DIAGNOSIS — D235 Other benign neoplasm of skin of trunk: Secondary | ICD-10-CM | POA: Diagnosis not present

## 2023-09-20 DIAGNOSIS — L821 Other seborrheic keratosis: Secondary | ICD-10-CM | POA: Diagnosis not present

## 2023-09-20 DIAGNOSIS — L853 Xerosis cutis: Secondary | ICD-10-CM | POA: Diagnosis not present

## 2023-09-25 DIAGNOSIS — Z0000 Encounter for general adult medical examination without abnormal findings: Secondary | ICD-10-CM | POA: Diagnosis not present

## 2023-09-25 DIAGNOSIS — I4891 Unspecified atrial fibrillation: Secondary | ICD-10-CM | POA: Diagnosis not present

## 2023-09-25 DIAGNOSIS — E039 Hypothyroidism, unspecified: Secondary | ICD-10-CM | POA: Diagnosis not present

## 2023-09-25 DIAGNOSIS — E119 Type 2 diabetes mellitus without complications: Secondary | ICD-10-CM | POA: Diagnosis not present

## 2023-09-25 DIAGNOSIS — D649 Anemia, unspecified: Secondary | ICD-10-CM | POA: Diagnosis not present

## 2023-09-25 DIAGNOSIS — I10 Essential (primary) hypertension: Secondary | ICD-10-CM | POA: Diagnosis not present

## 2023-10-25 DIAGNOSIS — H401113 Primary open-angle glaucoma, right eye, severe stage: Secondary | ICD-10-CM | POA: Diagnosis not present

## 2023-10-25 DIAGNOSIS — H401121 Primary open-angle glaucoma, left eye, mild stage: Secondary | ICD-10-CM | POA: Diagnosis not present

## 2023-11-01 DIAGNOSIS — H401113 Primary open-angle glaucoma, right eye, severe stage: Secondary | ICD-10-CM | POA: Diagnosis not present

## 2023-11-01 DIAGNOSIS — H401121 Primary open-angle glaucoma, left eye, mild stage: Secondary | ICD-10-CM | POA: Diagnosis not present

## 2023-11-01 DIAGNOSIS — E113393 Type 2 diabetes mellitus with moderate nonproliferative diabetic retinopathy without macular edema, bilateral: Secondary | ICD-10-CM | POA: Diagnosis not present

## 2023-11-01 DIAGNOSIS — H02206 Unspecified lagophthalmos left eye, unspecified eyelid: Secondary | ICD-10-CM | POA: Diagnosis not present

## 2023-11-01 DIAGNOSIS — Z961 Presence of intraocular lens: Secondary | ICD-10-CM | POA: Diagnosis not present

## 2023-11-06 ENCOUNTER — Ambulatory Visit (INDEPENDENT_AMBULATORY_CARE_PROVIDER_SITE_OTHER): Admitting: Podiatry

## 2023-11-06 DIAGNOSIS — Z91198 Patient's noncompliance with other medical treatment and regimen for other reason: Secondary | ICD-10-CM

## 2023-11-06 NOTE — Progress Notes (Signed)
 1. Failure to attend appointment with reason given    Clinic running late; patient rescheduled for another date.

## 2023-11-13 ENCOUNTER — Encounter: Payer: Self-pay | Admitting: Podiatry

## 2023-11-13 ENCOUNTER — Ambulatory Visit: Admitting: Podiatry

## 2023-11-13 DIAGNOSIS — D689 Coagulation defect, unspecified: Secondary | ICD-10-CM | POA: Diagnosis not present

## 2023-11-13 DIAGNOSIS — E119 Type 2 diabetes mellitus without complications: Secondary | ICD-10-CM | POA: Diagnosis not present

## 2023-11-13 DIAGNOSIS — M79676 Pain in unspecified toe(s): Secondary | ICD-10-CM

## 2023-11-13 DIAGNOSIS — B351 Tinea unguium: Secondary | ICD-10-CM

## 2023-11-17 NOTE — Progress Notes (Signed)
  Subjective:  Patient ID: Lisa Crawford, female    DOB: Jun 20, 1935,  MRN: [id number]  Lisa Crawford presents to clinic today for at risk foot care with h/o diabetes, coagulation defect and painful elongated mycotic toenails 1-5 bilaterally which are tender when wearing enclosed shoe gear. Pain is relieved with periodic professional debridement.  Chief Complaint  Patient presents with   Toe Pain    Dr. Valora is her PCP. Last visit was in June. A1c 6.8   New problem(s): None.   PCP is Valora Lynwood FALCON, MD.  Allergies  Allergen Reactions   Antihistamines, Chlorpheniramine-Type Other (See Comments)    Stay awake   Antihistamines, Diphenhydramine-Type Other (See Comments)    KEEPS AWAKE AND VERY WIRED   Clemastine Other (See Comments)    KEEPS AWAKE AND VERY WIRED KEEPS AWAKE AND VERY WIRED    Review of Systems: Negative except as noted in the HPI.  Objective: No changes noted in today's physical examination. There were no vitals filed for this visit. Lisa Crawford is a pleasant 88 y.o. female WD, WN in NAD. AAO x 3.  Vascular Examination: Capillary refill time immediate b/l. Vascular status intact b/l with palpable pedal pulses. Pedal hair present b/l. No pain with calf compression b/l. Skin temperature gradient WNL b/l. No cyanosis or clubbing b/l. No ischemia or gangrene noted b/l.   Neurological Examination: Sensation grossly intact b/l with 10 gram monofilament. Vibratory sensation intact b/l.   Dermatological Examination: Pedal skin with normal turgor, texture and tone b/l.  No open wounds. No interdigital macerations.   Toenails 1-5 b/l thick, discolored, elongated with subungual debris and pain on dorsal palpation.   No corns, calluses nor porokeratotic lesions noted.  Musculoskeletal Examination: Muscle strength 5/5 to all lower extremity muscle groups bilaterally. No gross bony deformities bilaterally. Utilizes cane for ambulation assistance.  Radiographs:  None  Assessment/Plan: 1. Pain due to onychomycosis of toenail   2. Coagulation disorder   3. Diabetes mellitus without complication [facility])   Consent given for treatment. Patient examined. All patient's and/or POA's questions/concerns addressed on today's visit. Toenails 1-5 b/l debrided in length and girth without incident. Continue foot and shoe inspections daily. Monitor blood glucose per PCP/Endocrinologist's recommendations. Continue soft, supportive shoe gear daily. Report any pedal injuries to medical professional. Call office if there are any questions/concerns. -Patient/POA to call should there be question/concern in the interim.   Return in about 3 months (around 02/13/2024).  Lisa Crawford, DPM      [place] LOCATION: [address], [place] [address]                   Office [phone]   [facility] LOCATION: [address] [place], [place] [address] Office [phone]

## 2023-11-30 DIAGNOSIS — Z79899 Other long term (current) drug therapy: Secondary | ICD-10-CM | POA: Diagnosis not present

## 2023-11-30 DIAGNOSIS — F3172 Bipolar disorder, in full remission, most recent episode hypomanic: Secondary | ICD-10-CM | POA: Diagnosis not present

## 2023-12-27 DIAGNOSIS — Z0184 Encounter for antibody response examination: Secondary | ICD-10-CM | POA: Diagnosis not present

## 2024-02-23 ENCOUNTER — Ambulatory Visit: Admitting: Podiatry

## 2024-05-24 ENCOUNTER — Ambulatory Visit: Admitting: Podiatry
